# Patient Record
Sex: Female | Born: 1995
Health system: Southern US, Community
[De-identification: ages and names within clinical notes are randomized; demographics above are authoritative.]

## PROBLEM LIST (undated history)

## (undated) DIAGNOSIS — Z789 Other specified health status: Secondary | ICD-10-CM

---
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
## 2017-06-24 ENCOUNTER — Encounter: Payer: Self-pay | Admitting: Family Medicine

## 2017-06-24 ENCOUNTER — Ambulatory Visit (INDEPENDENT_AMBULATORY_CARE_PROVIDER_SITE_OTHER): Payer: BLUE CROSS/BLUE SHIELD | Admitting: Family Medicine

## 2017-06-24 ENCOUNTER — Other Ambulatory Visit: Payer: Self-pay

## 2017-06-24 VITALS — BP 110/62 | HR 96 | Temp 99.2°F | Resp 17 | Ht 69.0 in | Wt 156.8 lb

## 2017-06-24 DIAGNOSIS — N912 Amenorrhea, unspecified: Secondary | ICD-10-CM

## 2017-06-24 DIAGNOSIS — Z3201 Encounter for pregnancy test, result positive: Secondary | ICD-10-CM | POA: Diagnosis not present

## 2017-06-24 DIAGNOSIS — O210 Mild hyperemesis gravidarum: Secondary | ICD-10-CM

## 2017-06-24 LAB — POCT URINE PREGNANCY: Preg Test, Ur: POSITIVE — AB

## 2017-06-24 NOTE — Patient Instructions (Addendum)
Prenatal Vitamin with [facility]  Make an Appointment Call [phone] to make an appointment.     IF you received an x-ray today, you will receive an invoice from [facility]. Please contact [facility] at [phone] with questions or concerns regarding your invoice.   IF you received labwork today, you will receive an invoice from [facility]. Please contact [facility] at [phone] with questions or concerns regarding your invoice.   Our billing staff will not be able to assist you with questions regarding bills from these companies.  You will be contacted with the lab results as soon as they are available. The fastest way to get your results is to activate your My Chart account. Instructions are located on the last page of this paperwork. If you have not heard from [place] regarding the results in 2 weeks, please contact this office.     Prenatal Care WHAT IS PRENATAL CARE? Prenatal care is the process of caring for a pregnant woman before she gives birth. Prenatal care makes sure that she and her baby remain as healthy as possible throughout pregnancy. Prenatal care may be provided by a midwife, family practice health care provider, or a childbirth and pregnancy specialist (obstetrician). Prenatal care may include physical examinations, testing, treatments, and education on nutrition, lifestyle, and social support services. WHY IS PRENATAL CARE SO IMPORTANT? Early and consistent prenatal care increases the chance that you and your baby will remain healthy throughout your pregnancy. This type of care also decreases a baby's risk of being born too early (prematurely), or being born smaller than expected (small for gestational age). Any underlying medical conditions you may have that could pose a risk during your pregnancy are discussed during prenatal care visits. You will also be monitored regularly for any new conditions that may arise during your pregnancy so they can be treated  quickly and effectively. WHAT HAPPENS DURING PRENATAL CARE VISITS? Prenatal care visits may include the following: Discussion Tell your health care provider about any new signs or symptoms you have experienced since your last visit. These might include:  Nausea or vomiting.  Increased or decreased level of energy.  Difficulty sleeping.  Back or leg pain.  Weight changes.  Frequent urination.  Shortness of breath with physical activity.  Changes in your skin, such as the development of a rash or itchiness.  Vaginal discharge or bleeding.  Feelings of excitement or nervousness.  Changes in your baby's movements.  You may want to write down any questions or topics you want to discuss with your health care provider and bring them with you to your appointment. Examination During your first prenatal care visit, you will likely have a complete physical exam. Your health care provider will often examine your vagina, cervix, and the position of your uterus, as well as check your heart, lungs, and other body systems. As your pregnancy progresses, your health care provider will measure the size of your uterus and your baby's position inside your uterus. He or she may also examine you for early signs of labor. Your prenatal visits may also include checking your blood pressure and, after about 10-12 weeks of pregnancy, listening to your baby's heartbeat. Testing Regular testing often includes:  Urinalysis. This checks your urine for glucose, protein, or signs of infection.  Blood count. This checks the levels of white and red blood cells in your body.  Tests for sexually transmitted infections (STIs). Testing for STIs at the beginning of pregnancy is routinely done and is required in  many states.  Antibody testing. You will be checked to see if you are immune to certain illnesses, such as rubella, that can affect a developing fetus.  Glucose screen. Around 24-28 weeks of pregnancy, your  blood glucose level will be checked for signs of gestational diabetes. Follow-up tests may be recommended.  Group B strep. This is a bacteria that is commonly found inside a woman's vagina. This test will inform your health care provider if you need an antibiotic to reduce the amount of this bacteria in your body prior to labor and childbirth.  Ultrasound. Many pregnant women undergo an ultrasound screening around 18-20 weeks of pregnancy to evaluate the health of the fetus and check for any developmental abnormalities.  HIV (human immunodeficiency virus) testing. Early in your pregnancy, you will be screened for HIV. If you are at high risk for HIV, this test may be repeated during your third trimester of pregnancy.  You may be offered other testing based on your age, personal or family medical history, or other factors. HOW OFTEN SHOULD I PLAN TO SEE MY HEALTH CARE PROVIDER FOR PRENATAL CARE? Your prenatal care check-up schedule depends on any medical conditions you have before, or develop during, your pregnancy. If you do not have any underlying medical conditions, you will likely be seen for checkups:  Monthly, during the first 6 months of pregnancy.  Twice a month during months 7 and 8 of pregnancy.  Weekly starting in the 9th month of pregnancy and until delivery.  If you develop signs of early labor or other concerning signs or symptoms, you may need to see your health care provider more often. Ask your health care provider what prenatal care schedule is best for you. WHAT CAN I DO TO KEEP MYSELF AND MY BABY AS HEALTHY AS POSSIBLE DURING MY PREGNANCY?  Take a prenatal vitamin containing 400 micrograms (0.4 mg) of folic acid every day. Your health care provider may also ask you to take additional vitamins such as iodine, vitamin D, iron, copper, and zinc.  Take 1500-2000 mg of calcium daily starting at your 20th week of pregnancy until you deliver your baby.  Make sure you are up to  date on your vaccinations. Unless directed otherwise by your health care provider: ? You should receive a tetanus, diphtheria, and pertussis (Tdap) vaccination between the 27th and 36th week of your pregnancy, regardless of when your last Tdap immunization occurred. This helps protect your baby from whooping cough (pertussis) after he or she is born. ? You should receive an annual inactivated influenza vaccine (IIV) to help protect you and your baby from influenza. This can be done at any point during your pregnancy.  Eat a well-rounded diet that includes: ? Fresh fruits and vegetables. ? Lean proteins. ? Calcium-rich foods such as milk, yogurt, hard cheeses, and dark, leafy greens. ? Whole grain breads.  Do noteat seafood high in mercury, including: ? Swordfish. ? Tilefish. ? Shark. ? King mackerel. ? More than 6 oz tuna per week.  Do not eat: ? Raw or undercooked meats or eggs. ? Unpasteurized foods, such as soft cheeses (brie, blue, or feta), juices, and milks. ? Lunch meats. ? Hot dogs that have not been heated until they are steaming.  Drink enough water to keep your urine clear or pale yellow. For many women, this may be 10 or more 8 oz glasses of water each day. Keeping yourself hydrated helps deliver nutrients to your baby and may prevent the start of  pre-term uterine contractions.  Do not use any tobacco products including cigarettes, chewing tobacco, or electronic cigarettes. If you need help quitting, ask your health care provider.  Do not drink beverages containing alcohol. No safe level of alcohol consumption during pregnancy has been determined.  Do not use any illegal drugs. These can harm your developing baby or cause a miscarriage.  Ask your health care provider or pharmacist before taking any prescription or over-the-counter medicines, herbs, or supplements.  Limit your caffeine intake to no more than 200 mg per day.  Exercise. Unless told otherwise by your  health care provider, try to get 30 minutes of moderate exercise most days of the week. Do not  do high-impact activities, contact sports, or activities with a high risk of falling, such as horseback riding or downhill skiing.  Get plenty of rest.  Avoid anything that raises your body temperature, such as hot tubs and saunas.  If you own a cat, do not empty its litter box. Bacteria contained in cat feces can cause an infection called toxoplasmosis. This can result in serious harm to the fetus.  Stay away from chemicals such as insecticides, lead, mercury, and cleaning or paint products that contain solvents.  Do not have any X-rays taken unless medically necessary.  Take a childbirth and breastfeeding preparation class. Ask your health care provider if you need a referral or recommendation.  This information is not intended to replace advice given to you by your health care provider. Make sure you discuss any questions you have with your health care provider. Document Released: 01/02/2003 Document Revised: 06/04/2015 Document Reviewed: 03/16/2013 Elsevier Interactive Patient Education  2017 [employer]

## 2017-06-24 NOTE — Progress Notes (Signed)
Chief Complaint  Patient presents with  . pregnancy test    HPI   G1P0 She was not on contraception This is her first pregnancy This was not a planned pregnancy She currently has nausea  Her EDD 02/18/2018 based on estimated 1st day of LMP Patient's last menstrual period was 05/14/2017.  Nonsmoker No alcohol use   No past medical history on file.  Current Outpatient Medications  Medication Sig Dispense Refill  . diazepam (VALIUM) 5 MG tablet Take 1 tablet (5 mg total) by mouth every 6 (six) hours as needed for anxiety (spasms). (Patient not taking: Reported on 06/24/2017) 8 tablet 0  . Ibuprofen (ADVIL PO) Take 2 tablets by mouth once. For pain    . naproxen (NAPROSYN) 500 MG tablet Take 1 tablet (500 mg total) by mouth 2 (two) times daily. (Patient not taking: Reported on 06/24/2017) 20 tablet 0  . ondansetron (ZOFRAN ODT) 4 MG disintegrating tablet 4mg  ODT q4 hours prn nausea/vomit (Patient not taking: Reported on 06/24/2017) 6 tablet 0  . PRESCRIPTION MEDICATION Take 0.5 tablets by mouth 2 (two) times daily as needed (pain). Pain medication for urinary tract infection     No current facility-administered medications for this visit.     Allergies: No Known Allergies  No past surgical history on file.  Social History   Socioeconomic History  . Marital status: Single    Spouse name: Not on file  . Number of children: Not on file  . Years of education: Not on file  . Highest education level: Not on file  Occupational History  . Not on file  Social Needs  . Financial resource strain: Not on file  . Food insecurity:    Worry: Not on file    Inability: Not on file  . Transportation needs:    Medical: Not on file    Non-medical: Not on file  Tobacco Use  . Smoking status: Never Smoker  Substance and Sexual Activity  . Alcohol use: No  . Drug use: Not on file  . Sexual activity: Not on file  Lifestyle  . Physical activity:    Days per week: Not on file    Minutes  per session: Not on file  . Stress: Not on file  Relationships  . Social connections:    Talks on phone: Not on file    Gets together: Not on file    Attends religious service: Not on file    Active member of club or organization: Not on file    Attends meetings of clubs or organizations: Not on file    Relationship status: Not on file  Other Topics Concern  . Not on file  Social History Narrative  . Not on file    No family history on file.   ROS Review of Systems See HPI Constitution: No fevers or chills No malaise No diaphoresis Skin: No rash or itching Eyes: no blurry vision, no double vision GU: no dysuria or hematuria Neuro: no dizziness or headaches all others reviewed and negative   Objective: Vitals:   06/24/17 1007  BP: 110/62  Pulse: 96  Resp: 17  Temp: 99.2 F (37.3 C)  TempSrc: Oral  SpO2: 99%  Weight: 156 lb 12.8 oz (71.1 kg)  Height: 5\' 9"  (1.753 m)    Physical Exam  Constitutional: She is oriented to person, place, and time. She appears well-developed and well-nourished.  HENT:  Head: Normocephalic and atraumatic.  Right Ear: External ear normal.  Left Ear:  External ear normal.  Eyes: Conjunctivae and EOM are normal.  Neck: Normal range of motion. Neck supple. No thyromegaly present.  Cardiovascular: Normal rate, regular rhythm and normal heart sounds.  No murmur heard. Pulmonary/Chest: Effort normal and breath sounds normal. No stridor. No respiratory distress.  Neurological: She is alert and oriented to person, place, and time.  Skin: Skin is warm. Capillary refill takes less than 2 seconds.  Psychiatric: She has a normal mood and affect. Her behavior is normal. Judgment and thought content normal.    Assessment and Plan Brittany Gaines was seen today for pregnancy test.  Diagnoses and all orders for this visit:  Amenorrhea -     POCT urine pregnancy  Positive pregnancy test  Morning sickness   Advised pt to call [facility] to  establish care with OB Discussed diet and medication     Brittany Gaines A Creta Levin

## 2017-07-22 ENCOUNTER — Encounter: Payer: Self-pay | Admitting: Certified Nurse Midwife

## 2017-07-22 ENCOUNTER — Other Ambulatory Visit (HOSPITAL_COMMUNITY)
Admission: RE | Admit: 2017-07-22 | Discharge: 2017-07-22 | Disposition: A | Discharge status: Home / Self Care | Payer: Medicaid Other | Source: Ambulatory Visit | Attending: Obstetrics and Gynecology | Admitting: Obstetrics and Gynecology

## 2017-07-22 ENCOUNTER — Ambulatory Visit (INDEPENDENT_AMBULATORY_CARE_PROVIDER_SITE_OTHER): Payer: Medicaid Other | Admitting: Obstetrics and Gynecology

## 2017-07-22 DIAGNOSIS — Z3400 Encounter for supervision of normal first pregnancy, unspecified trimester: Secondary | ICD-10-CM | POA: Insufficient documentation

## 2017-07-22 DIAGNOSIS — Z3401 Encounter for supervision of normal first pregnancy, first trimester: Secondary | ICD-10-CM | POA: Diagnosis present

## 2017-07-22 NOTE — Patient Instructions (Signed)
 First Trimester of Pregnancy The first trimester of pregnancy is from week 1 until the end of week 13 (months 1 through 3). A week after a sperm fertilizes an egg, the egg will implant on the wall of the uterus. This embryo will begin to develop into a baby. Genes from you and your partner will form the baby. The female genes will determine whether the baby will be a boy or a girl. At 6-8 weeks, the eyes and face will be formed, and the heartbeat can be seen on ultrasound. At the end of 12 weeks, all the baby's organs will be formed. Now that you are pregnant, you will want to do everything you can to have a healthy baby. Two of the most important things are to get good prenatal care and to follow your health care provider's instructions. Prenatal care is all the medical care you receive before the baby's birth. This care will help prevent, find, and treat any problems during the pregnancy and childbirth. Body changes during your first trimester Your body goes through many changes during pregnancy. The changes vary from woman to woman.  You may gain or lose a couple of pounds at first.  You may feel sick to your stomach (nauseous) and you may throw up (vomit). If the vomiting is uncontrollable, call your health care provider.  You may tire easily.  You may develop headaches that can be relieved by medicines. All medicines should be approved by your health care provider.  You may urinate more often. Painful urination may mean you have a bladder infection.  You may develop heartburn as a result of your pregnancy.  You may develop constipation because certain hormones are causing the muscles that push stool through your intestines to slow down.  You may develop hemorrhoids or swollen veins (varicose veins).  Your breasts may begin to grow larger and become tender. Your nipples may stick out more, and the tissue that surrounds them (areola) may become darker.  Your gums may bleed and may be  sensitive to brushing and flossing.  Dark spots or blotches (chloasma, mask of pregnancy) may develop on your face. This will likely fade after the baby is born.  Your menstrual periods will stop.  You may have a loss of appetite.  You may develop cravings for certain kinds of food.  You may have changes in your emotions from day to day, such as being excited to be pregnant or being concerned that something may go wrong with the pregnancy and baby.  You may have more vivid and strange dreams.  You may have changes in your hair. These can include thickening of your hair, rapid growth, and changes in texture. Some women also have hair loss during or after pregnancy, or hair that feels dry or thin. Your hair will most likely return to normal after your baby is born.  What to expect at prenatal visits During a routine prenatal visit:  You will be weighed to make sure you and the baby are growing normally.  Your blood pressure will be taken.  Your abdomen will be measured to track your baby's growth.  The fetal heartbeat will be listened to between weeks 10 and 14 of your pregnancy.  Test results from any previous visits will be discussed.  Your health care provider may ask you:  How you are feeling.  If you are feeling the baby move.  If you have had any abnormal symptoms, such as leaking fluid, bleeding, severe   headaches, or abdominal cramping.  If you are using any tobacco products, including cigarettes, chewing tobacco, and electronic cigarettes.  If you have any questions.  Other tests that may be performed during your first trimester include:  Blood tests to find your blood type and to check for the presence of any previous infections. The tests will also be used to check for low iron levels (anemia) and protein on red blood cells (Rh antibodies). Depending on your risk factors, or if you previously had diabetes during pregnancy, you may have tests to check for high blood  sugar that affects pregnant women (gestational diabetes).  Urine tests to check for infections, diabetes, or protein in the urine.  An ultrasound to confirm the proper growth and development of the baby.  Fetal screens for spinal cord problems (spina bifida) and Down syndrome.  HIV (human immunodeficiency virus) testing. Routine prenatal testing includes screening for HIV, unless you choose not to have this test.  You may need other tests to make sure you and the baby are doing well.  Follow these instructions at home: Medicines  Follow your health care provider's instructions regarding medicine use. Specific medicines may be either safe or unsafe to take during pregnancy.  Take a prenatal vitamin that contains at least 600 micrograms (mcg) of folic acid.  If you develop constipation, try taking a stool softener if your health care provider approves. Eating and drinking  Eat a balanced diet that includes fresh fruits and vegetables, whole grains, good sources of protein such as meat, eggs, or tofu, and low-fat dairy. Your health care provider will help you determine the amount of weight gain that is right for you.  Avoid raw meat and uncooked cheese. These carry germs that can cause birth defects in the baby.  Eating four or five small meals rather than three large meals a day may help relieve nausea and vomiting. If you start to feel nauseous, eating a few soda crackers can be helpful. Drinking liquids between meals, instead of during meals, also seems to help ease nausea and vomiting.  Limit foods that are high in fat and processed sugars, such as fried and sweet foods.  To prevent constipation: ? Eat foods that are high in fiber, such as fresh fruits and vegetables, whole grains, and beans. ? Drink enough fluid to keep your urine clear or pale yellow. Activity  Exercise only as directed by your health care provider. Most women can continue their usual exercise routine during  pregnancy. Try to exercise for 30 minutes at least 5 days a week. Exercising will help you: ? Control your weight. ? Stay in shape. ? Be prepared for labor and delivery.  Experiencing pain or cramping in the lower abdomen or lower back is a good sign that you should stop exercising. Check with your health care provider before continuing with normal exercises.  Try to avoid standing for long periods of time. Move your legs often if you must stand in one place for a long time.  Avoid heavy lifting.  Wear low-heeled shoes and practice good posture.  You may continue to have sex unless your health care provider tells you not to. Relieving pain and discomfort  Wear a good support bra to relieve breast tenderness.  Take warm sitz baths to soothe any pain or discomfort caused by hemorrhoids. Use hemorrhoid cream if your health care provider approves.  Rest with your legs elevated if you have leg cramps or low back pain.  If you   develop varicose veins in your legs, wear support hose. Elevate your feet for 15 minutes, 3-4 times a day. Limit salt in your diet. Prenatal care  Schedule your prenatal visits by the twelfth week of pregnancy. They are usually scheduled monthly at first, then more often in the last 2 months before delivery.  Write down your questions. Take them to your prenatal visits.  Keep all your prenatal visits as told by your health care provider. This is important. Safety  Wear your seat belt at all times when driving.  Make a list of emergency phone numbers, including numbers for family, friends, the hospital, and police and fire departments. General instructions  Ask your health care provider for a referral to a local prenatal education class. Begin classes no later than the beginning of month 6 of your pregnancy.  Ask for help if you have counseling or nutritional needs during pregnancy. Your health care provider can offer advice or refer you to specialists for help  with various needs.  Do not use hot tubs, steam rooms, or saunas.  Do not douche or use tampons or scented sanitary pads.  Do not cross your legs for long periods of time.  Avoid cat litter boxes and soil used by cats. These carry germs that can cause birth defects in the baby and possibly loss of the fetus by miscarriage or stillbirth.  Avoid all smoking, herbs, alcohol, and medicines not prescribed by your health care provider. Chemicals in these products affect the formation and growth of the baby.  Do not use any products that contain nicotine or tobacco, such as cigarettes and e-cigarettes. If you need help quitting, ask your health care provider. You may receive counseling support and other resources to help you quit.  Schedule a dentist appointment. At home, brush your teeth with a soft toothbrush and be gentle when you floss. Contact a health care provider if:  You have dizziness.  You have mild pelvic cramps, pelvic pressure, or nagging pain in the abdominal area.  You have persistent nausea, vomiting, or diarrhea.  You have a bad smelling vaginal discharge.  You have pain when you urinate.  You notice increased swelling in your face, hands, legs, or ankles.  You are exposed to fifth disease or chickenpox.  You are exposed to German measles (rubella) and have never had it. Get help right away if:  You have a fever.  You are leaking fluid from your vagina.  You have spotting or bleeding from your vagina.  You have severe abdominal cramping or pain.  You have rapid weight gain or loss.  You vomit blood or material that looks like coffee grounds.  You develop a severe headache.  You have shortness of breath.  You have any kind of trauma, such as from a fall or a car accident. Summary  The first trimester of pregnancy is from week 1 until the end of week 13 (months 1 through 3).  Your body goes through many changes during pregnancy. The changes vary from  woman to woman.  You will have routine prenatal visits. During those visits, your health care provider will examine you, discuss any test results you may have, and talk with you about how you are feeling. This information is not intended to replace advice given to you by your health care provider. Make sure you discuss any questions you have with your health care provider. Document Released: 12/24/2000 Document Revised: 12/12/2015 Document Reviewed: 12/12/2015 [employer] Interactive Patient Education  2018   [employer]   Second Trimester of Pregnancy The second trimester is from week 14 through week 27 (months 4 through 6). The second trimester is often a time when you feel your best. Your body has adjusted to being pregnant, and you begin to feel better physically. Usually, morning sickness has lessened or quit completely, you may have more energy, and you may have an increase in appetite. The second trimester is also a time when the fetus is growing rapidly. At the end of the sixth month, the fetus is about 9 inches long and weighs about 1 pounds. You will likely begin to feel the baby move (quickening) between 16 and 20 weeks of pregnancy. Body changes during your second trimester Your body continues to go through many changes during your second trimester. The changes vary from woman to woman.  Your weight will continue to increase. You will notice your lower abdomen bulging out.  You may begin to get stretch marks on your hips, abdomen, and breasts.  You may develop headaches that can be relieved by medicines. The medicines should be approved by your health care provider.  You may urinate more often because the fetus is pressing on your bladder.  You may develop or continue to have heartburn as a result of your pregnancy.  You may develop constipation because certain hormones are causing the muscles that push waste through your intestines to slow down.  You may develop hemorrhoids or  swollen, bulging veins (varicose veins).  You may have back pain. This is caused by: ? Weight gain. ? Pregnancy hormones that are relaxing the joints in your pelvis. ? A shift in weight and the muscles that support your balance.  Your breasts will continue to grow and they will continue to become tender.  Your gums may bleed and may be sensitive to brushing and flossing.  Dark spots or blotches (chloasma, mask of pregnancy) may develop on your face. This will likely fade after the baby is born.  A dark line from your belly button to the pubic area (linea nigra) may appear. This will likely fade after the baby is born.  You may have changes in your hair. These can include thickening of your hair, rapid growth, and changes in texture. Some women also have hair loss during or after pregnancy, or hair that feels dry or thin. Your hair will most likely return to normal after your baby is born.  What to expect at prenatal visits During a routine prenatal visit:  You will be weighed to make sure you and the fetus are growing normally.  Your blood pressure will be taken.  Your abdomen will be measured to track your baby's growth.  The fetal heartbeat will be listened to.  Any test results from the previous visit will be discussed.  Your health care provider may ask you:  How you are feeling.  If you are feeling the baby move.  If you have had any abnormal symptoms, such as leaking fluid, bleeding, severe headaches, or abdominal cramping.  If you are using any tobacco products, including cigarettes, chewing tobacco, and electronic cigarettes.  If you have any questions.  Other tests that may be performed during your second trimester include:  Blood tests that check for: ? Low iron levels (anemia). ? High blood sugar that affects pregnant women (gestational diabetes) between 24 and 28 weeks. ? Rh antibodies. This is to check for a protein on red blood cells (Rh factor).  Urine  tests to   check for infections, diabetes, or protein in the urine.  An ultrasound to confirm the proper growth and development of the baby.  An amniocentesis to check for possible genetic problems.  Fetal screens for spina bifida and Down syndrome.  HIV (human immunodeficiency virus) testing. Routine prenatal testing includes screening for HIV, unless you choose not to have this test.  Follow these instructions at home: Medicines  Follow your health care provider's instructions regarding medicine use. Specific medicines may be either safe or unsafe to take during pregnancy.  Take a prenatal vitamin that contains at least 600 micrograms (mcg) of folic acid.  If you develop constipation, try taking a stool softener if your health care provider approves. Eating and drinking  Eat a balanced diet that includes fresh fruits and vegetables, whole grains, good sources of protein such as meat, eggs, or tofu, and low-fat dairy. Your health care provider will help you determine the amount of weight gain that is right for you.  Avoid raw meat and uncooked cheese. These carry germs that can cause birth defects in the baby.  If you have low calcium intake from food, talk to your health care provider about whether you should take a daily calcium supplement.  Limit foods that are high in fat and processed sugars, such as fried and sweet foods.  To prevent constipation: ? Drink enough fluid to keep your urine clear or pale yellow. ? Eat foods that are high in fiber, such as fresh fruits and vegetables, whole grains, and beans. Activity  Exercise only as directed by your health care provider. Most women can continue their usual exercise routine during pregnancy. Try to exercise for 30 minutes at least 5 days a week. Stop exercising if you experience uterine contractions.  Avoid heavy lifting, wear low heel shoes, and practice good posture.  A sexual relationship may be continued unless your health  care provider directs you otherwise. Relieving pain and discomfort  Wear a good support bra to prevent discomfort from breast tenderness.  Take warm sitz baths to soothe any pain or discomfort caused by hemorrhoids. Use hemorrhoid cream if your health care provider approves.  Rest with your legs elevated if you have leg cramps or low back pain.  If you develop varicose veins, wear support hose. Elevate your feet for 15 minutes, 3-4 times a day. Limit salt in your diet. Prenatal Care  Write down your questions. Take them to your prenatal visits.  Keep all your prenatal visits as told by your health care provider. This is important. Safety  Wear your seat belt at all times when driving.  Make a list of emergency phone numbers, including numbers for family, friends, the hospital, and police and fire departments. General instructions  Ask your health care provider for a referral to a local prenatal education class. Begin classes no later than the beginning of month 6 of your pregnancy.  Ask for help if you have counseling or nutritional needs during pregnancy. Your health care provider can offer advice or refer you to specialists for help with various needs.  Do not use hot tubs, steam rooms, or saunas.  Do not douche or use tampons or scented sanitary pads.  Do not cross your legs for long periods of time.  Avoid cat litter boxes and soil used by cats. These carry germs that can cause birth defects in the baby and possibly loss of the fetus by miscarriage or stillbirth.  Avoid all smoking, herbs, alcohol, and unprescribed drugs. Chemicals   in these products can affect the formation and growth of the baby.  Do not use any products that contain nicotine or tobacco, such as cigarettes and e-cigarettes. If you need help quitting, ask your health care provider.  Visit your dentist if you have not gone yet during your pregnancy. Use a soft toothbrush to brush your teeth and be gentle when  you floss. Contact a health care provider if:  You have dizziness.  You have mild pelvic cramps, pelvic pressure, or nagging pain in the abdominal area.  You have persistent nausea, vomiting, or diarrhea.  You have a bad smelling vaginal discharge.  You have pain when you urinate. Get help right away if:  You have a fever.  You are leaking fluid from your vagina.  You have spotting or bleeding from your vagina.  You have severe abdominal cramping or pain.  You have rapid weight gain or weight loss.  You have shortness of breath with chest pain.  You notice sudden or extreme swelling of your face, hands, ankles, feet, or legs.  You have not felt your baby move in over an hour.  You have severe headaches that do not go away when you take medicine.  You have vision changes. Summary  The second trimester is from week 14 through week 27 (months 4 through 6). It is also a time when the fetus is growing rapidly.  Your body goes through many changes during pregnancy. The changes vary from woman to woman.  Avoid all smoking, herbs, alcohol, and unprescribed drugs. These chemicals affect the formation and growth your baby.  Do not use any tobacco products, such as cigarettes, chewing tobacco, and e-cigarettes. If you need help quitting, ask your health care provider.  Contact your health care provider if you have any questions. Keep all prenatal visits as told by your health care provider. This is important. This information is not intended to replace advice given to you by your health care provider. Make sure you discuss any questions you have with your health care provider. Document Released: 12/24/2000 Document Revised: 02/05/2016 Document Reviewed: 02/05/2016 [employer] Interactive Patient Education  2018 [employer]   Contraception Choices Contraception, also called birth control, refers to methods or devices that prevent pregnancy. Hormonal methods Contraceptive  implant A contraceptive implant is a thin, plastic tube that contains a hormone. It is inserted into the upper part of the arm. It can remain in place for up to 3 years. Progestin-only injections Progestin-only injections are injections of progestin, a synthetic form of the hormone progesterone. They are given every 3 months by a health care provider. Birth control pills Birth control pills are pills that contain hormones that prevent pregnancy. They must be taken once a day, preferably at the same time each day. Birth control patch The birth control patch contains hormones that prevent pregnancy. It is placed on the skin and must be changed once a week for three weeks and removed on the fourth week. A prescription is needed to use this method of contraception. Vaginal ring A vaginal ring contains hormones that prevent pregnancy. It is placed in the vagina for three weeks and removed on the fourth week. After that, the process is repeated with a new ring. A prescription is needed to use this method of contraception. Emergency contraceptive Emergency contraceptives prevent pregnancy after unprotected sex. They come in pill form and can be taken up to 5 days after sex. They work best the sooner they are taken after having   sex. Most emergency contraceptives are available without a prescription. This method should not be used as your only form of birth control. Barrier methods Female condom A female condom is a thin sheath that is worn over the penis during sex. Condoms keep sperm from going inside a woman's body. They can be used with a spermicide to increase their effectiveness. They should be disposed after a single use. Female condom A female condom is a soft, loose-fitting sheath that is put into the vagina before sex. The condom keeps sperm from going inside a woman's body. They should be disposed after a single use. Diaphragm A diaphragm is a soft, dome-shaped barrier. It is inserted into the vagina  before sex, along with a spermicide. The diaphragm blocks sperm from entering the uterus, and the spermicide kills sperm. A diaphragm should be left in the vagina for 6-8 hours after sex and removed within 24 hours. A diaphragm is prescribed and fitted by a health care provider. A diaphragm should be replaced every 1-2 years, after giving birth, after gaining more than 15 lb (6.8 kg), and after pelvic surgery. Cervical cap A cervical cap is a round, soft latex or plastic cup that fits over the cervix. It is inserted into the vagina before sex, along with spermicide. It blocks sperm from entering the uterus. The cap should be left in place for 6-8 hours after sex and removed within 48 hours. A cervical cap must be prescribed and fitted by a health care provider. It should be replaced every 2 years. Sponge A sponge is a soft, circular piece of polyurethane foam with spermicide on it. The sponge helps block sperm from entering the uterus, and the spermicide kills sperm. To use it, you make it wet and then insert it into the vagina. It should be inserted before sex, left in for at least 6 hours after sex, and removed and thrown away within 30 hours. Spermicides Spermicides are chemicals that kill or block sperm from entering the cervix and uterus. They can come as a cream, jelly, suppository, foam, or tablet. A spermicide should be inserted into the vagina with an applicator at least 10-15 minutes before sex to allow time for it to work. The process must be repeated every time you have sex. Spermicides do not require a prescription. Intrauterine contraception Intrauterine device (IUD) An IUD is a T-shaped device that is put in a woman's uterus. There are two types:  Hormone IUD.This type contains progestin, a synthetic form of the hormone progesterone. This type can stay in place for 3-5 years.  Copper IUD.This type is wrapped in copper wire. It can stay in place for 10 years.  Permanent methods of  contraception Female tubal ligation In this method, a woman's fallopian tubes are sealed, tied, or blocked during surgery to prevent eggs from traveling to the uterus. Hysteroscopic sterilization In this method, a small, flexible insert is placed into each fallopian tube. The inserts cause scar tissue to form in the fallopian tubes and block them, so sperm cannot reach an egg. The procedure takes about 3 months to be effective. Another form of birth control must be used during those 3 months. Female sterilization This is a procedure to tie off the tubes that carry sperm (vasectomy). After the procedure, the man can still ejaculate fluid (semen). Natural planning methods Natural family planning In this method, a couple does not have sex on days when the woman could become pregnant. Calendar method This means keeping track of the   length of each menstrual cycle, identifying the days when pregnancy can happen, and not having sex on those days. Ovulation method In this method, a couple avoids sex during ovulation. Symptothermal method This method involves not having sex during ovulation. The woman typically checks for ovulation by watching changes in her temperature and in the consistency of cervical mucus. Post-ovulation method In this method, a couple waits to have sex until after ovulation. Summary  Contraception, also called birth control, means methods or devices that prevent pregnancy.  Hormonal methods of contraception include implants, injections, pills, patches, vaginal rings, and emergency contraceptives.  Barrier methods of contraception can include female condoms, female condoms, diaphragms, cervical caps, sponges, and spermicides.  There are two types of IUDs (intrauterine devices). An IUD can be put in a woman's uterus to prevent pregnancy for 3-5 years.  Permanent sterilization can be done through a procedure for males, females, or both.  Natural family planning methods involve  not having sex on days when the woman could become pregnant. This information is not intended to replace advice given to you by your health care provider. Make sure you discuss any questions you have with your health care provider. Document Released: 12/30/2004 Document Revised: 02/02/2016 Document Reviewed: 02/02/2016 Elsevier Interactive Patient Education  2018 [employer] Inc.   Breastfeeding Choosing to breastfeed is one of the best decisions you can make for yourself and your baby. A change in hormones during pregnancy causes your breasts to make breast milk in your milk-producing glands. Hormones prevent breast milk from being released before your baby is born. They also prompt milk flow after birth. Once breastfeeding has begun, thoughts of your baby, as well as his or her sucking or crying, can stimulate the release of milk from your milk-producing glands. Benefits of breastfeeding Research shows that breastfeeding offers many health benefits for infants and mothers. It also offers a cost-free and convenient way to feed your baby. For your baby  Your first milk (colostrum) helps your baby's digestive system to function better.  Special cells in your milk (antibodies) help your baby to fight off infections.  Breastfed babies are less likely to develop asthma, allergies, obesity, or type 2 diabetes. They are also at lower risk for sudden infant death syndrome (SIDS).  Nutrients in breast milk are better able to meet your baby's needs compared to infant formula.  Breast milk improves your baby's brain development. For you  Breastfeeding helps to create a very special bond between you and your baby.  Breastfeeding is convenient. Breast milk costs nothing and is always available at the correct temperature.  Breastfeeding helps to burn calories. It helps you to lose the weight that you gained during pregnancy.  Breastfeeding makes your uterus return faster to its size before pregnancy.  It also slows bleeding (lochia) after you give birth.  Breastfeeding helps to lower your risk of developing type 2 diabetes, osteoporosis, rheumatoid arthritis, cardiovascular disease, and breast, ovarian, uterine, and endometrial cancer later in life. Breastfeeding basics Starting breastfeeding  Find a comfortable place to sit or lie down, with your neck and back well-supported.  Place a pillow or a rolled-up blanket under your baby to bring him or her to the level of your breast (if you are seated). Nursing pillows are specially designed to help support your arms and your baby while you breastfeed.  Make sure that your baby's tummy (abdomen) is facing your abdomen.  Gently massage your breast. With your fingertips, massage from the outer edges of   your breast inward toward the nipple. This encourages milk flow. If your milk flows slowly, you may need to continue this action during the feeding.  Support your breast with 4 fingers underneath and your thumb above your nipple (make the letter "C" with your hand). Make sure your fingers are well away from your nipple and your baby's mouth.  Stroke your baby's lips gently with your finger or nipple.  When your baby's mouth is open wide enough, quickly bring your baby to your breast, placing your entire nipple and as much of the areola as possible into your baby's mouth. The areola is the colored area around your nipple. ? More areola should be visible above your baby's upper lip than below the lower lip. ? Your baby's lips should be opened and extended outward (flanged) to ensure an adequate, comfortable latch. ? Your baby's tongue should be between his or her lower gum and your breast.  Make sure that your baby's mouth is correctly positioned around your nipple (latched). Your baby's lips should create a seal on your breast and be turned out (everted).  It is common for your baby to suck about 2-3 minutes in order to start the flow of breast  milk. Latching Teaching your baby how to latch onto your breast properly is very important. An improper latch can cause nipple pain, decreased milk supply, and poor weight gain in your baby. Also, if your baby is not latched onto your nipple properly, he or she may swallow some air during feeding. This can make your baby fussy. Burping your baby when you switch breasts during the feeding can help to get rid of the air. However, teaching your baby to latch on properly is still the best way to prevent fussiness from swallowing air while breastfeeding. Signs that your baby has successfully latched onto your nipple  Silent tugging or silent sucking, without causing you pain. Infant's lips should be extended outward (flanged).  Swallowing heard between every 3-4 sucks once your milk has started to flow (after your let-down milk reflex occurs).  Muscle movement above and in front of his or her ears while sucking.  Signs that your baby has not successfully latched onto your nipple  Sucking sounds or smacking sounds from your baby while breastfeeding.  Nipple pain.  If you think your baby has not latched on correctly, slip your finger into the corner of your baby's mouth to break the suction and place it between your baby's gums. Attempt to start breastfeeding again. Signs of successful breastfeeding Signs from your baby  Your baby will gradually decrease the number of sucks or will completely stop sucking.  Your baby will fall asleep.  Your baby's body will relax.  Your baby will retain a small amount of milk in his or her mouth.  Your baby will let go of your breast by himself or herself.  Signs from you  Breasts that have increased in firmness, weight, and size 1-3 hours after feeding.  Breasts that are softer immediately after breastfeeding.  Increased milk volume, as well as a change in milk consistency and color by the fifth day of breastfeeding.  Nipples that are not sore,  cracked, or bleeding.  Signs that your baby is getting enough milk  Wetting at least 1-2 diapers during the first 24 hours after birth.  Wetting at least 5-6 diapers every 24 hours for the first week after birth. The urine should be clear or pale yellow by the age of 5   days.  Wetting 6-8 diapers every 24 hours as your baby continues to grow and develop.  At least 3 stools in a 24-hour period by the age of 5 days. The stool should be soft and yellow.  At least 3 stools in a 24-hour period by the age of 7 days. The stool should be seedy and yellow.  No loss of weight greater than 10% of birth weight during the first 3 days of life.  Average weight gain of 4-7 oz (113-198 g) per week after the age of 4 days.  Consistent daily weight gain by the age of 5 days, without weight loss after the age of 2 weeks. After a feeding, your baby may spit up a small amount of milk. This is normal. Breastfeeding frequency and duration Frequent feeding will help you make more milk and can prevent sore nipples and extremely full breasts (breast engorgement). Breastfeed when you feel the need to reduce the fullness of your breasts or when your baby shows signs of hunger. This is called "breastfeeding on demand." Signs that your baby is hungry include:  Increased alertness, activity, or restlessness.  Movement of the head from side to side.  Opening of the mouth when the corner of the mouth or cheek is stroked (rooting).  Increased sucking sounds, smacking lips, cooing, sighing, or squeaking.  Hand-to-mouth movements and sucking on fingers or hands.  Fussing or crying.  Avoid introducing a pacifier to your baby in the first 4-6 weeks after your baby is born. After this time, you may choose to use a pacifier. Research has shown that pacifier use during the first year of a baby's life decreases the risk of sudden infant death syndrome (SIDS). Allow your baby to feed on each breast as long as he or she  wants. When your baby unlatches or falls asleep while feeding from the first breast, offer the second breast. Because newborns are often sleepy in the first few weeks of life, you may need to awaken your baby to get him or her to feed. Breastfeeding times will vary from baby to baby. However, the following rules can serve as a guide to help you make sure that your baby is properly fed:  Newborns (babies 4 weeks of age or younger) may breastfeed every 1-3 hours.  Newborns should not go without breastfeeding for longer than 3 hours during the day or 5 hours during the night.  You should breastfeed your baby a minimum of 8 times in a 24-hour period.  Breast milk pumping Pumping and storing breast milk allows you to make sure that your baby is exclusively fed your breast milk, even at times when you are unable to breastfeed. This is especially important if you go back to work while you are still breastfeeding, or if you are not able to be present during feedings. Your lactation consultant can help you find a method of pumping that works best for you and give you guidelines about how long it is safe to store breast milk. Caring for your breasts while you breastfeed Nipples can become dry, cracked, and sore while breastfeeding. The following recommendations can help keep your breasts moisturized and healthy:  Avoid using soap on your nipples.  Wear a supportive bra designed especially for nursing. Avoid wearing underwire-style bras or extremely tight bras (sports bras).  Air-dry your nipples for 3-4 minutes after each feeding.  Use only cotton bra pads to absorb leaked breast milk. Leaking of breast milk between feedings is normal.    Use lanolin on your nipples after breastfeeding. Lanolin helps to maintain your skin's normal moisture barrier. Pure lanolin is not harmful (not toxic) to your baby. You may also hand express a few drops of breast milk and gently massage that milk into your nipples and  allow the milk to air-dry.  In the first few weeks after giving birth, some women experience breast engorgement. Engorgement can make your breasts feel heavy, warm, and tender to the touch. Engorgement peaks within 3-5 days after you give birth. The following recommendations can help to ease engorgement:  Completely empty your breasts while breastfeeding or pumping. You may want to start by applying warm, moist heat (in the shower or with warm, water-soaked hand towels) just before feeding or pumping. This increases circulation and helps the milk flow. If your baby does not completely empty your breasts while breastfeeding, pump any extra milk after he or she is finished.  Apply ice packs to your breasts immediately after breastfeeding or pumping, unless this is too uncomfortable for you. To do this: ? Put ice in a plastic bag. ? Place a towel between your skin and the bag. ? Leave the ice on for 20 minutes, 2-3 times a day.  Make sure that your baby is latched on and positioned properly while breastfeeding.  If engorgement persists after 48 hours of following these recommendations, contact your health care provider or a lactation consultant. Overall health care recommendations while breastfeeding  Eat 3 healthy meals and 3 snacks every day. Well-nourished mothers who are breastfeeding need an additional 450-500 calories a day. You can meet this requirement by increasing the amount of a balanced diet that you eat.  Drink enough water to keep your urine pale yellow or clear.  Rest often, relax, and continue to take your prenatal vitamins to prevent fatigue, stress, and low vitamin and mineral levels in your body (nutrient deficiencies).  Do not use any products that contain nicotine or tobacco, such as cigarettes and e-cigarettes. Your baby may be harmed by chemicals from cigarettes that pass into breast milk and exposure to secondhand smoke. If you need help quitting, ask your health care  provider.  Avoid alcohol.  Do not use illegal drugs or marijuana.  Talk with your health care provider before taking any medicines. These include over-the-counter and prescription medicines as well as vitamins and herbal supplements. Some medicines that may be harmful to your baby can pass through breast milk.  It is possible to become pregnant while breastfeeding. If birth control is desired, ask your health care provider about options that will be safe while breastfeeding your baby. Where to find more information: [employer]: www.llli.org Contact a health care provider if:  You feel like you want to stop breastfeeding or have become frustrated with breastfeeding.  Your nipples are cracked or bleeding.  Your breasts are red, tender, or warm.  You have: ? Painful breasts or nipples. ? A swollen area on either breast. ? A fever or chills. ? Nausea or vomiting. ? Drainage other than breast milk from your nipples.  Your breasts do not become full before feedings by the fifth day after you give birth.  You feel sad and depressed.  Your baby is: ? Too sleepy to eat well. ? Having trouble sleeping. ? More than 1 week old and wetting fewer than 6 diapers in a 24-hour period. ? Not gaining weight by 5 days of age.  Your baby has fewer than 3 stools in a   24-hour period.  Your baby's skin or the white parts of his or her eyes become yellow. Get help right away if:  Your baby is overly tired (lethargic) and does not want to wake up and feed.  Your baby develops an unexplained fever. Summary  Breastfeeding offers many health benefits for infant and mothers.  Try to breastfeed your infant when he or she shows early signs of hunger.  Gently tickle or stroke your baby's lips with your finger or nipple to allow the baby to open his or her mouth. Bring the baby to your breast. Make sure that much of the areola is in your baby's mouth. Offer one side and burp the  baby before you offer the other side.  Talk with your health care provider or lactation consultant if you have questions or you face problems as you breastfeed. This information is not intended to replace advice given to you by your health care provider. Make sure you discuss any questions you have with your health care provider. Document Released: 12/30/2004 Document Revised: 02/01/2016 Document Reviewed: 02/01/2016 Elsevier Interactive Patient Education  2018 Elsevier Inc.  

## 2017-07-22 NOTE — Progress Notes (Signed)
Pt presents for initial NOB visit. This is not a planned pregnancy and they do live together.

## 2017-07-22 NOTE — Progress Notes (Signed)
  Subjective:    Brittany Gaines is a G1P0 425w6d being seen today for her first obstetrical visit.  Her obstetrical history is significant for first unplanned pregnancy. Patient does intend to breast feed. Pregnancy history fully reviewed.  Patient reports no complaints.  Vitals:   07/22/17 1327  BP: 136/82  Pulse: 95  Weight: 163 lb 11.2 oz (74.3 kg)    HISTORY: OB History  Gravida Para Term Preterm AB Living  1            SAB TAB Ectopic Multiple Live Births               # Outcome Date GA Lbr Len/2nd Weight Sex Delivery Anes PTL Lv  1 Current            History reviewed. No pertinent past medical history. History reviewed. No pertinent surgical history. Family History  Problem Relation Age of Onset  . Birth defects Neg Hx   . Hearing loss Neg Hx   . Heart disease Neg Hx   . Miscarriages / Stillbirths Neg Hx      Exam    Uterus:   10-weeks in size  Pelvic Exam:    Perineum: No Hemorrhoids, Normal Perineum   Vulva: normal   Vagina:  normal mucosa, normal discharge   pH:    Cervix: nulliparous appearance   Adnexa: normal adnexa and no mass, fullness, tenderness   Bony Pelvis: gynecoid  System: Breast:  normal appearance, no masses or tenderness   Skin: normal coloration and turgor, no rashes    Neurologic: oriented, no focal deficits   Extremities: normal strength, tone, and muscle mass   HEENT extra ocular movement intact   Mouth/Teeth mucous membranes moist, pharynx normal without lesions and dental hygiene good   Neck supple and no masses   Cardiovascular: regular rate and rhythm   Respiratory:  appears well, vitals normal, no respiratory distress, acyanotic, normal RR, chest clear, no wheezing, crepitations, rhonchi, normal symmetric air entry   Abdomen: soft, non-tender; bowel sounds normal; no masses,  no organomegaly   Urinary:       Assessment:    Pregnancy: G1P0 Patient Active Problem List   Diagnosis Date Noted  . Supervision of normal first  pregnancy, antepartum 07/22/2017        Plan:     Initial labs drawn. Prenatal vitamins. Problem list reviewed and updated. Genetic Screening discussed : Panorama ordered.  Ultrasound discussed; fetal survey: requested. Patient enrolled in BRx app  Follow up in 4 weeks. 50% of 30 min visit spent on counseling and coordination of care.     Jeramy Dimmick 07/22/2017

## 2017-07-23 LAB — CYTOLOGY - PAP
Chlamydia: NEGATIVE
Diagnosis: NEGATIVE
Neisseria Gonorrhea: NEGATIVE
Trichomonas: NEGATIVE

## 2017-07-24 LAB — OBSTETRIC PANEL, INCLUDING HIV
Antibody Screen: NEGATIVE
BASOS ABS: 0 10*3/uL (ref 0.0–0.2)
Basos: 0 %
EOS (ABSOLUTE): 0 10*3/uL (ref 0.0–0.4)
Eos: 0 %
HEP B S AG: NEGATIVE
HIV Screen 4th Generation wRfx: NONREACTIVE
Hematocrit: 42.2 % (ref 34.0–46.6)
Hemoglobin: 13.8 g/dL (ref 11.1–15.9)
Immature Grans (Abs): 0 10*3/uL (ref 0.0–0.1)
Immature Granulocytes: 0 %
Lymphocytes Absolute: 1.6 10*3/uL (ref 0.7–3.1)
Lymphs: 19 %
MCH: 30.3 pg (ref 26.6–33.0)
MCHC: 32.7 g/dL (ref 31.5–35.7)
MCV: 93 fL (ref 79–97)
Monocytes Absolute: 0.6 10*3/uL (ref 0.1–0.9)
Monocytes: 7 %
Neutrophils Absolute: 6.4 10*3/uL (ref 1.4–7.0)
Neutrophils: 74 %
PLATELETS: 229 10*3/uL (ref 150–450)
RBC: 4.56 x10E6/uL (ref 3.77–5.28)
RDW: 13 % (ref 12.3–15.4)
RPR: NONREACTIVE
Rh Factor: POSITIVE
Rubella Antibodies, IGG: <0.9 index — ABNORMAL LOW (ref >0.99)
WBC: 8.7 10*3/uL (ref 3.4–10.8)

## 2017-07-24 LAB — HEMOGLOBINOPATHY EVALUATION
HEMOGLOBIN A2 QUANTITATION: 2 % (ref 1.8–3.2)
HEMOGLOBIN F QUANTITATION: 0 % (ref 0.0–2.0)
HGB C: 0 %
HGB S: 0 %
HGB VARIANT: 0 %
Hgb A: 98 % (ref 96.4–98.8)

## 2017-07-24 LAB — URINE CULTURE, OB REFLEX: Organism ID, Bacteria: NO GROWTH

## 2017-07-24 LAB — CULTURE, OB URINE

## 2017-07-28 ENCOUNTER — Encounter: Payer: Self-pay | Admitting: *Deleted

## 2017-07-29 LAB — SMN1 COPY NUMBER ANALYSIS (SMA CARRIER SCREENING)

## 2017-07-29 LAB — CYSTIC FIBROSIS MUTATION 97: Interpretation: NOT DETECTED

## 2017-08-12 ENCOUNTER — Encounter: Payer: Self-pay | Admitting: Obstetrics and Gynecology

## 2017-08-12 DIAGNOSIS — Z789 Other specified health status: Secondary | ICD-10-CM | POA: Insufficient documentation

## 2017-08-19 ENCOUNTER — Encounter: Payer: Self-pay | Admitting: Obstetrics and Gynecology

## 2017-08-19 ENCOUNTER — Ambulatory Visit (INDEPENDENT_AMBULATORY_CARE_PROVIDER_SITE_OTHER): Payer: Medicaid Other | Admitting: Obstetrics and Gynecology

## 2017-08-19 VITALS — BP 138/82 | HR 80 | Wt 164.8 lb

## 2017-08-19 DIAGNOSIS — Z3400 Encounter for supervision of normal first pregnancy, unspecified trimester: Secondary | ICD-10-CM

## 2017-08-19 NOTE — Progress Notes (Signed)
   PRENATAL VISIT NOTE  Subjective:  Brittany Gaines is a 22 y.o. G1P0 at 263w6d being seen today for ongoing prenatal care.  She is currently monitored for the following issues for this low-risk pregnancy and has Supervision of normal first pregnancy, antepartum and Not immune to rubella on their problem list.  Patient reports some headaches.  Contractions: Not present. Vag. Bleeding: None.  Movement: Absent. Denies leaking of fluid.   The following portions of the patient's history were reviewed and updated as appropriate: allergies, current medications, past family history, past medical history, past social history, past surgical history and problem list. Problem list updated.  Objective:   Vitals:   08/19/17 1613  BP: 138/82  Pulse: 80  Weight: 164 lb 12.8 oz (74.8 kg)    Fetal Status: Fetal Heart Rate (bpm): + on sono   Movement: Absent     General:  Alert, oriented and cooperative. Patient is in no acute distress.  Skin: Skin is warm and dry. No rash noted.   Cardiovascular: Normal heart rate noted  Respiratory: Normal respiratory effort, no problems with respiration noted  Abdomen: Soft, gravid, appropriate for gestational age.  Pain/Pressure: Absent     Pelvic: Cervical exam deferred        Extremities: Normal range of motion.     Mental Status: Normal mood and affect. Normal behavior. Normal judgment and thought content.   Assessment and Plan:  Pregnancy: G1P0 at 363w6d  1. Supervision of normal first pregnancy, antepartum Patient is doing well Encouraged patient to increase hydration and to take tylenol as needed Anatomy ultrasound ordered - US MFM OB COMP + 14 WK; Future  Preterm labor symptoms and general obstetric precautions including but not limited to vaginal bleeding, contractions, leaking of fluid and fetal movement were reviewed in detail with the patient. Please refer to After Visit Summary for other counseling recommendations.  Return in about 1 month  (around 09/16/2017) for ROB.  No future appointments.  Catalina AntiguaPeggy Lawayne Hartig, MD

## 2017-08-19 NOTE — Progress Notes (Signed)
Pt is G1P0 1766w6d here for ROB. Pt is complaining of HA for the past couple weeks off and on. Pt denies any visual changes or swelling.

## 2017-09-17 ENCOUNTER — Encounter: Payer: Self-pay | Admitting: Obstetrics and Gynecology

## 2017-09-17 ENCOUNTER — Ambulatory Visit (INDEPENDENT_AMBULATORY_CARE_PROVIDER_SITE_OTHER): Payer: Medicaid Other | Admitting: Obstetrics and Gynecology

## 2017-09-17 ENCOUNTER — Encounter (HOSPITAL_COMMUNITY): Payer: Self-pay

## 2017-09-17 VITALS — BP 137/79 | HR 80 | Wt 170.2 lb

## 2017-09-17 DIAGNOSIS — Z789 Other specified health status: Secondary | ICD-10-CM

## 2017-09-17 DIAGNOSIS — Z3400 Encounter for supervision of normal first pregnancy, unspecified trimester: Secondary | ICD-10-CM

## 2017-09-17 NOTE — Progress Notes (Signed)
ROB Scheduled anatomy 09/25/10 Pt has a few  concerns today.  Pt states she nervous at every visit believes causing B/P to be elevated. Pt has a list of concerns for provider.

## 2017-09-17 NOTE — Progress Notes (Signed)
   PRENATAL VISIT NOTE  Subjective:  Brittany Gaines is a 22 y.o. G1P0 at [redacted]w[redacted]d being seen today for ongoing prenatal care.  She is currently monitored for the following issues for this low-risk pregnancy and has Supervision of normal first pregnancy, antepartum and Not immune to rubella on their problem list.  Patient reports no complaints.  Contractions: Not present. Vag. Bleeding: None.  Movement: Present. Denies leaking of fluid.   The following portions of the patient's history were reviewed and updated as appropriate: allergies, current medications, past family history, past medical history, past social history, past surgical history and problem list. Problem list updated.  Objective:   Vitals:   09/17/17 1356  BP: 137/79  Pulse: 80  Weight: 170 lb 3.2 oz (77.2 kg)    Fetal Status: Fetal Heart Rate (bpm): 150   Movement: Present     General:  Alert, oriented and cooperative. Patient is in no acute distress.  Skin: Skin is warm and dry. No rash noted.   Cardiovascular: Normal heart rate noted  Respiratory: Normal respiratory effort, no problems with respiration noted  Abdomen: Soft, gravid, appropriate for gestational age.  Pain/Pressure: Present     Pelvic: Cervical exam deferred        Extremities: Normal range of motion.  Edema: None  Mental Status: Normal mood and affect. Normal behavior. Normal judgment and thought content.   Assessment and Plan:  Pregnancy: G1P0 at [redacted]w[redacted]d  1. Supervision of normal first pregnancy, antepartum Patient is doing well without complaints Anatomy ultrasound on 9/12 AFP today  2. Not immune to rubella Will offer pp  Preterm labor symptoms and general obstetric precautions including but not limited to vaginal bleeding, contractions, leaking of fluid and fetal movement were reviewed in detail with the patient. Please refer to After Visit Summary for other counseling recommendations.  No follow-ups on file.  Future Appointments  Date Time  Provider Department Center  09/17/2017  4:00 PM Sharel Behne, Gigi Gin, MD [facility] None  09/24/2017  1:30 PM [facility] [place] [facility]    Catalina Antigua, MD

## 2017-09-19 LAB — AFP, SERUM, OPEN SPINA BIFIDA
AFP MoM: 0.86
AFP Value: 33.4 ng/mL
GEST. AGE ON COLLECTION DATE: 18 wk
MATERNAL AGE AT EDD: 24 a
OSBR Risk 1 IN: 10000
Test Results:: NEGATIVE
WEIGHT: 170 [lb_av]

## 2017-09-23 ENCOUNTER — Encounter (HOSPITAL_COMMUNITY): Payer: Self-pay

## 2017-09-24 ENCOUNTER — Encounter (HOSPITAL_COMMUNITY): Payer: Self-pay

## 2017-09-24 ENCOUNTER — Ambulatory Visit (HOSPITAL_COMMUNITY)
Admission: RE | Admit: 2017-09-24 | Discharge: 2017-09-24 | Disposition: A | Discharge status: Home / Self Care | Payer: Medicaid Other | Source: Ambulatory Visit | Attending: Obstetrics and Gynecology | Admitting: Obstetrics and Gynecology

## 2017-09-24 DIAGNOSIS — Z3A19 19 weeks gestation of pregnancy: Secondary | ICD-10-CM | POA: Diagnosis not present

## 2017-09-24 DIAGNOSIS — Z3482 Encounter for supervision of other normal pregnancy, second trimester: Secondary | ICD-10-CM | POA: Diagnosis not present

## 2017-09-24 DIAGNOSIS — Z3400 Encounter for supervision of normal first pregnancy, unspecified trimester: Secondary | ICD-10-CM | POA: Diagnosis present

## 2017-09-24 DIAGNOSIS — Z363 Encounter for antenatal screening for malformations: Secondary | ICD-10-CM

## 2017-10-15 ENCOUNTER — Encounter: Payer: Self-pay | Admitting: Obstetrics and Gynecology

## 2017-10-15 ENCOUNTER — Ambulatory Visit (INDEPENDENT_AMBULATORY_CARE_PROVIDER_SITE_OTHER): Payer: Medicaid Other | Admitting: Obstetrics and Gynecology

## 2017-10-15 VITALS — BP 133/91 | HR 84 | Wt 179.0 lb

## 2017-10-15 DIAGNOSIS — Z3400 Encounter for supervision of normal first pregnancy, unspecified trimester: Secondary | ICD-10-CM

## 2017-10-15 DIAGNOSIS — Z23 Encounter for immunization: Secondary | ICD-10-CM | POA: Diagnosis not present

## 2017-10-15 DIAGNOSIS — Z3402 Encounter for supervision of normal first pregnancy, second trimester: Secondary | ICD-10-CM

## 2017-10-15 DIAGNOSIS — Z789 Other specified health status: Secondary | ICD-10-CM

## 2017-10-15 NOTE — Progress Notes (Signed)
Pt is here for ROB. G1P0 [redacted]w[redacted]d. Flu vaccine given today.

## 2017-10-15 NOTE — Progress Notes (Signed)
   PRENATAL VISIT NOTE  Subjective:  Brittany Gaines is a 22 y.o. G1P0 at 62w0dbeing seen today for ongoing prenatal care.  She is currently monitored for the following issues for this low-risk pregnancy and has Supervision of normal first pregnancy, antepartum and Not immune to rubella on their problem list.  Patient reports no complaints.  Contractions: Not present. Vag. Bleeding: None.  Movement: Present. Denies leaking of fluid.   The following portions of the patient's history were reviewed and updated as appropriate: allergies, current medications, past family history, past medical history, past social history, past surgical history and problem list. Problem list updated.  Objective:   Vitals:   10/15/17 1427  BP: (!) 133/91  Pulse: 84  Weight: 179 lb (81.2 kg)    Fetal Status: Fetal Heart Rate (bpm): 148   Movement: Present     General:  Alert, oriented and cooperative. Patient is in no acute distress.  Skin: Skin is warm and dry. No rash noted.   Cardiovascular: Normal heart rate noted  Respiratory: Normal respiratory effort, no problems with respiration noted  Abdomen: Soft, gravid, appropriate for gestational age.  Pain/Pressure: Absent     Pelvic: Cervical exam deferred        Extremities: Normal range of motion.  Edema: None  Mental Status: Normal mood and affect. Normal behavior. Normal judgment and thought content.   Assessment and Plan:  Pregnancy: G1P0 at 222w0d1. Supervision of normal first pregnancy, antepartum - Flu Vaccine QUAD 36+ mos IM (Fluarix, Quad PF)  2. Not immune to rubella MMR pp  Preterm labor symptoms and general obstetric precautions including but not limited to vaginal bleeding, contractions, leaking of fluid and fetal movement were reviewed in detail with the patient. Please refer to After Visit Summary for other counseling recommendations.  Return in about 4 weeks (around 11/12/2017) for OB visit.  No future appointments.  KeSloan LeiterMD

## 2017-10-22 ENCOUNTER — Telehealth: Payer: Self-pay

## 2017-10-22 NOTE — Telephone Encounter (Signed)
Left VM for patient to follow up from mychart messages yesterday. Advised pt to call back to let us know how she's feeling.

## 2017-11-12 ENCOUNTER — Encounter: Payer: Self-pay | Admitting: Obstetrics and Gynecology

## 2017-11-12 ENCOUNTER — Ambulatory Visit (INDEPENDENT_AMBULATORY_CARE_PROVIDER_SITE_OTHER): Payer: Medicaid Other | Admitting: Obstetrics and Gynecology

## 2017-11-12 ENCOUNTER — Other Ambulatory Visit: Payer: Self-pay

## 2017-11-12 VITALS — BP 137/90 | HR 97 | Wt 186.9 lb

## 2017-11-12 DIAGNOSIS — R030 Elevated blood-pressure reading, without diagnosis of hypertension: Secondary | ICD-10-CM

## 2017-11-12 DIAGNOSIS — Z3400 Encounter for supervision of normal first pregnancy, unspecified trimester: Secondary | ICD-10-CM

## 2017-11-12 DIAGNOSIS — Z789 Other specified health status: Secondary | ICD-10-CM

## 2017-11-12 DIAGNOSIS — O139 Gestational [pregnancy-induced] hypertension without significant proteinuria, unspecified trimester: Secondary | ICD-10-CM | POA: Insufficient documentation

## 2017-11-12 NOTE — Progress Notes (Signed)
   PRENATAL VISIT NOTE  Subjective:  Brittany Gaines is a 22 y.o. G1P0 at 40w0dbeing seen today for ongoing prenatal care.  She is currently monitored for the following issues for this low-risk pregnancy and has Supervision of normal first pregnancy, antepartum; Not immune to rubella; and Elevated blood pressure reading in office without diagnosis of hypertension on their problem list.  Patient reports occasional pain under right ribs.  Contractions: Not present. Vag. Bleeding: None.  Movement: Present. Denies leaking of fluid.   The following portions of the patient's history were reviewed and updated as appropriate: allergies, current medications, past family history, past medical history, past social history, past surgical history and problem list. Problem list updated.  Objective:   Vitals:   11/12/17 1535  BP: 137/90  Pulse: 97  Weight: 186 lb 14.4 oz (84.8 kg)    Fetal Status: Fetal Heart Rate (bpm): 159   Movement: Present     General:  Alert, oriented and cooperative. Patient is in no acute distress.  Skin: Skin is warm and dry. No rash noted.   Cardiovascular: Normal heart rate noted  Respiratory: Normal respiratory effort, no problems with respiration noted  Abdomen: Soft, gravid, appropriate for gestational age.  Pain/Pressure: Absent     Pelvic: Cervical exam deferred        Extremities: Normal range of motion.  Edema: None  Mental Status: Normal mood and affect. Normal behavior. Normal judgment and thought content.   Assessment and Plan:  Pregnancy: G1P0 at 251w0d1. Supervision of normal first pregnancy, antepartum  2. Not immune to rubella MMR pp  3. Elevated blood pressure reading in office without diagnosis of hypertension Diastolic > 90 x2 starting 22 weeks Likely gestational hypertension - CBC - Comp Met (CMET) - Protein / creatinine ratio, urine   Preterm labor symptoms and general obstetric precautions including but not limited to vaginal bleeding,  contractions, leaking of fluid and fetal movement were reviewed in detail with the patient. Please refer to After Visit Summary for other counseling recommendations.  Return in about 2 weeks (around 11/26/2017) for 3rd trim labs, 2 hr GTT, OB visit (MD).  No future appointments.  KeSloan LeiterMD

## 2017-11-12 NOTE — Progress Notes (Signed)
ROB.  C/o pain under right ribs

## 2017-11-13 LAB — CBC
HEMATOCRIT: 33.1 % — AB (ref 34.0–46.6)
Hemoglobin: 11.5 g/dL (ref 11.1–15.9)
MCH: 31.9 pg (ref 26.6–33.0)
MCHC: 34.7 g/dL (ref 31.5–35.7)
MCV: 92 fL (ref 79–97)
Platelets: 226 10*3/uL (ref 150–450)
RBC: 3.6 x10E6/uL — ABNORMAL LOW (ref 3.77–5.28)
RDW: 12 % — ABNORMAL LOW (ref 12.3–15.4)
WBC: 10.6 10*3/uL (ref 3.4–10.8)

## 2017-11-13 LAB — COMPREHENSIVE METABOLIC PANEL
ALT: 8 IU/L (ref 0–32)
AST: 9 IU/L (ref 0–40)
Albumin/Globulin Ratio: 1.5 (ref 1.2–2.2)
Albumin: 3.8 g/dL (ref 3.5–5.5)
Alkaline Phosphatase: 69 IU/L (ref 39–117)
BUN/Creatinine Ratio: 19 (ref 9–23)
BUN: 12 mg/dL (ref 6–20)
Bilirubin Total: <0.2 mg/dL (ref 0.0–1.2)
CALCIUM: 9 mg/dL (ref 8.7–10.2)
CO2: 20 mmol/L (ref 20–29)
CREATININE: 0.63 mg/dL (ref 0.57–1.00)
Chloride: 103 mmol/L (ref 96–106)
GFR calc Af Amer: 146 mL/min/{1.73_m2} (ref >59)
GFR, EST NON AFRICAN AMERICAN: 127 mL/min/{1.73_m2} (ref >59)
Globulin, Total: 2.5 g/dL (ref 1.5–4.5)
Glucose: 77 mg/dL (ref 65–99)
Potassium: 3.9 mmol/L (ref 3.5–5.2)
Sodium: 136 mmol/L (ref 134–144)
Total Protein: 6.3 g/dL (ref 6.0–8.5)

## 2017-11-13 LAB — PROTEIN / CREATININE RATIO, URINE
Creatinine, Urine: 200.1 mg/dL
PROTEIN/CREAT RATIO: 106 mg/g{creat} (ref 0–200)
Protein, Ur: 21.2 mg/dL

## 2017-11-27 ENCOUNTER — Encounter: Payer: Self-pay | Admitting: Obstetrics and Gynecology

## 2017-11-27 ENCOUNTER — Other Ambulatory Visit: Payer: Medicaid Other

## 2017-11-27 ENCOUNTER — Ambulatory Visit (INDEPENDENT_AMBULATORY_CARE_PROVIDER_SITE_OTHER): Payer: Medicaid Other | Admitting: Obstetrics and Gynecology

## 2017-11-27 VITALS — BP 129/82 | HR 108 | Wt 194.0 lb

## 2017-11-27 DIAGNOSIS — Z3403 Encounter for supervision of normal first pregnancy, third trimester: Secondary | ICD-10-CM | POA: Diagnosis not present

## 2017-11-27 DIAGNOSIS — O133 Gestational [pregnancy-induced] hypertension without significant proteinuria, third trimester: Secondary | ICD-10-CM

## 2017-11-27 DIAGNOSIS — Z23 Encounter for immunization: Secondary | ICD-10-CM | POA: Diagnosis not present

## 2017-11-27 DIAGNOSIS — O139 Gestational [pregnancy-induced] hypertension without significant proteinuria, unspecified trimester: Secondary | ICD-10-CM

## 2017-11-27 DIAGNOSIS — Z3400 Encounter for supervision of normal first pregnancy, unspecified trimester: Secondary | ICD-10-CM

## 2017-11-27 NOTE — Progress Notes (Signed)
Pt is here for ROB and GTT. G1P0 8511w1d.

## 2017-11-27 NOTE — Progress Notes (Signed)
   PRENATAL VISIT NOTE  Subjective:  Brittany Gaines is a 22 y.o. G1P0 at 7788w1d being seen today for ongoing prenatal care.  She is currently monitored for the following issues for this high-risk pregnancy and has Supervision of normal first pregnancy, antepartum; Not immune to rubella; and Gestational hypertension without significant proteinuria, antepartum on their problem list.  Patient reports no complaints.  Contractions: Not present. Vag. Bleeding: None.  Movement: Present. Denies leaking of fluid.   The following portions of the patient's history were reviewed and updated as appropriate: allergies, current medications, past family history, past medical history, past social history, past surgical history and problem list. Problem list updated.  Objective:   Vitals:   11/27/17 0914  BP: 129/82  Pulse: (!) 108  Weight: 194 lb (88 kg)    Fetal Status:     Movement: Present     General:  Alert, oriented and cooperative. Patient is in no acute distress.  Skin: Skin is warm and dry. No rash noted.   Cardiovascular: Normal heart rate noted  Respiratory: Normal respiratory effort, no problems with respiration noted  Abdomen: Soft, gravid, appropriate for gestational age.  Pain/Pressure: Present     Pelvic: Cervical exam deferred        Extremities: Normal range of motion.     Mental Status: Normal mood and affect. Normal behavior. Normal judgment and thought content.   Assessment and Plan:  Pregnancy: G1P0 at 5888w1d  1. Supervision of normal first pregnancy, antepartum Patient is doing well Third trimester labs today - Glucose Tolerance, 2 Hours w/1 Hour - CBC - RPR - HIV Antibody (routine testing w rflx)  2. Gestational hypertension without significant proteinuria, antepartum Labs reviewed Will continue to monitor closely  Preterm labor symptoms and general obstetric precautions including but not limited to vaginal bleeding, contractions, leaking of fluid and fetal movement  were reviewed in detail with the patient. Please refer to After Visit Summary for other counseling recommendations.  Return in about 2 weeks (around 12/11/2017) for ROB.  Future Appointments  Date Time Provider Department Center  11/27/2017 10:30 AM Xion Debruyne, Gigi GinPeggy, MD [facility] None    Catalina AntiguaPeggy Sahir Tolson, MD

## 2017-11-27 NOTE — Patient Instructions (Signed)
Contraception Choices Contraception, also called birth control, refers to methods or devices that prevent pregnancy. Hormonal methods Contraceptive implant A contraceptive implant is a thin, plastic tube that contains a hormone. It is inserted into the upper part of the arm. It can remain in place for up to 3 years. Progestin-only injections Progestin-only injections are injections of progestin, a synthetic form of the hormone progesterone. They are given every 3 months by a health care provider. Birth control pills Birth control pills are pills that contain hormones that prevent pregnancy. They must be taken once a day, preferably at the same time each day. Birth control patch The birth control patch contains hormones that prevent pregnancy. It is placed on the skin and must be changed once a week for three weeks and removed on the fourth week. A prescription is needed to use this method of contraception. Vaginal ring A vaginal ring contains hormones that prevent pregnancy. It is placed in the vagina for three weeks and removed on the fourth week. After that, the process is repeated with a new ring. A prescription is needed to use this method of contraception. Emergency contraceptive Emergency contraceptives prevent pregnancy after unprotected sex. They come in pill form and can be taken up to 5 days after sex. They work best the sooner they are taken after having sex. Most emergency contraceptives are available without a prescription. This method should not be used as your only form of birth control. Barrier methods Female condom A female condom is a thin sheath that is worn over the penis during sex. Condoms keep sperm from going inside a woman's body. They can be used with a spermicide to increase their effectiveness. They should be disposed after a single use. Female condom A female condom is a soft, loose-fitting sheath that is put into the vagina before sex. The condom keeps sperm from going  inside a woman's body. They should be disposed after a single use. Diaphragm A diaphragm is a soft, dome-shaped barrier. It is inserted into the vagina before sex, along with a spermicide. The diaphragm blocks sperm from entering the uterus, and the spermicide kills sperm. A diaphragm should be left in the vagina for 6-8 hours after sex and removed within 24 hours. A diaphragm is prescribed and fitted by a health care provider. A diaphragm should be replaced every 1-2 years, after giving birth, after gaining more than 15 lb (6.8 kg), and after pelvic surgery. Cervical cap A cervical cap is a round, soft latex or plastic cup that fits over the cervix. It is inserted into the vagina before sex, along with spermicide. It blocks sperm from entering the uterus. The cap should be left in place for 6-8 hours after sex and removed within 48 hours. A cervical cap must be prescribed and fitted by a health care provider. It should be replaced every 2 years. Sponge A sponge is a soft, circular piece of polyurethane foam with spermicide on it. The sponge helps block sperm from entering the uterus, and the spermicide kills sperm. To use it, you make it wet and then insert it into the vagina. It should be inserted before sex, left in for at least 6 hours after sex, and removed and thrown away within 30 hours. Spermicides Spermicides are chemicals that kill or block sperm from entering the cervix and uterus. They can come as a cream, jelly, suppository, foam, or tablet. A spermicide should be inserted into the vagina with an applicator at least 10-15 minutes before   sex to allow time for it to work. The process must be repeated every time you have sex. Spermicides do not require a prescription. Intrauterine contraception Intrauterine device (IUD) An IUD is a T-shaped device that is put in a woman's uterus. There are two types:  Hormone IUD.This type contains progestin, a synthetic form of the hormone progesterone. This  type can stay in place for 3-5 years.  Copper IUD.This type is wrapped in copper wire. It can stay in place for 10 years.  Permanent methods of contraception Female tubal ligation In this method, a woman's fallopian tubes are sealed, tied, or blocked during surgery to prevent eggs from traveling to the uterus. Hysteroscopic sterilization In this method, a small, flexible insert is placed into each fallopian tube. The inserts cause scar tissue to form in the fallopian tubes and block them, so sperm cannot reach an egg. The procedure takes about 3 months to be effective. Another form of birth control must be used during those 3 months. Female sterilization This is a procedure to tie off the tubes that carry sperm (vasectomy). After the procedure, the man can still ejaculate fluid (semen). Natural planning methods Natural family planning In this method, a couple does not have sex on days when the woman could become pregnant. Calendar method This means keeping track of the length of each menstrual cycle, identifying the days when pregnancy can happen, and not having sex on those days. Ovulation method In this method, a couple avoids sex during ovulation. Symptothermal method This method involves not having sex during ovulation. The woman typically checks for ovulation by watching changes in her temperature and in the consistency of cervical mucus. Post-ovulation method In this method, a couple waits to have sex until after ovulation. Summary  Contraception, also called birth control, means methods or devices that prevent pregnancy.  Hormonal methods of contraception include implants, injections, pills, patches, vaginal rings, and emergency contraceptives.  Barrier methods of contraception can include female condoms, female condoms, diaphragms, cervical caps, sponges, and spermicides.  There are two types of IUDs (intrauterine devices). An IUD can be put in a woman's uterus to prevent pregnancy  for 3-5 years.  Permanent sterilization can be done through a procedure for males, females, or both.  Natural family planning methods involve not having sex on days when the woman could become pregnant. This information is not intended to replace advice given to you by your health care provider. Make sure you discuss any questions you have with your health care provider. Document Released: 12/30/2004 Document Revised: 02/02/2016 Document Reviewed: 02/02/2016 Elsevier Interactive Patient Education  2018 [employer]  

## 2017-11-28 LAB — CBC
Hematocrit: 33.2 % — ABNORMAL LOW (ref 34.0–46.6)
Hemoglobin: 11.6 g/dL (ref 11.1–15.9)
MCH: 31.8 pg (ref 26.6–33.0)
MCHC: 34.9 g/dL (ref 31.5–35.7)
MCV: 91 fL (ref 79–97)
Platelets: 224 10*3/uL (ref 150–450)
RBC: 3.65 x10E6/uL — ABNORMAL LOW (ref 3.77–5.28)
RDW: 11.8 % — AB (ref 12.3–15.4)
WBC: 8.2 10*3/uL (ref 3.4–10.8)

## 2017-11-28 LAB — RPR: RPR: NONREACTIVE

## 2017-11-28 LAB — GLUCOSE TOLERANCE, 2 HOURS W/ 1HR
GLUCOSE, 1 HOUR: 150 mg/dL (ref 65–179)
Glucose, 2 hour: 106 mg/dL (ref 65–152)
Glucose, Fasting: 77 mg/dL (ref 65–91)

## 2017-11-28 LAB — HIV ANTIBODY (ROUTINE TESTING W REFLEX): HIV SCREEN 4TH GENERATION: NONREACTIVE

## 2017-12-15 ENCOUNTER — Encounter: Payer: Medicaid Other | Admitting: Obstetrics & Gynecology

## 2017-12-15 ENCOUNTER — Ambulatory Visit (INDEPENDENT_AMBULATORY_CARE_PROVIDER_SITE_OTHER): Payer: Medicaid Other | Admitting: Obstetrics & Gynecology

## 2017-12-15 VITALS — BP 134/73 | HR 78 | Wt 201.8 lb

## 2017-12-15 DIAGNOSIS — Z3400 Encounter for supervision of normal first pregnancy, unspecified trimester: Secondary | ICD-10-CM

## 2017-12-15 DIAGNOSIS — O139 Gestational [pregnancy-induced] hypertension without significant proteinuria, unspecified trimester: Secondary | ICD-10-CM

## 2017-12-15 NOTE — Patient Instructions (Signed)

## 2017-12-15 NOTE — Progress Notes (Signed)
   PRENATAL VISIT NOTE  Subjective:  Brittany Gaines is a 22 y.o. G1P0 at 9042w5d being seen today for ongoing prenatal care.  She is currently monitored for the following issues for this high-risk pregnancy and has Supervision of normal first pregnancy, antepartum; Not immune to rubella; and Gestational hypertension without significant proteinuria, antepartum on their problem list.  Patient reports no complaints.  Contractions: Not present. Vag. Bleeding: None.  Movement: Present. Denies leaking of fluid.   The following portions of the patient's history were reviewed and updated as appropriate: allergies, current medications, past family history, past medical history, past social history, past surgical history and problem list. Problem list updated.  Objective:   Vitals:   12/15/17 1412  BP: 134/73  Pulse: 78  Weight: 201 lb 12.8 oz (91.5 kg)    Fetal Status: Fetal Heart Rate (bpm): 158   Movement: Present     General:  Alert, oriented and cooperative. Patient is in no acute distress.  Skin: Skin is warm and dry. No rash noted.   Cardiovascular: Normal heart rate noted  Respiratory: Normal respiratory effort, no problems with respiration noted  Abdomen: Soft, gravid, appropriate for gestational age.  Pain/Pressure: Present     Pelvic: Cervical exam deferred        Extremities: Normal range of motion.  Edema: Trace  Mental Status: Normal mood and affect. Normal behavior. Normal judgment and thought content.   Assessment and Plan:  Pregnancy: G1P0 at 1042w5d  1. Supervision of normal first pregnancy, antepartum Gest HTN  2. Gestational hypertension without significant proteinuria, antepartum U/S f/u and start fetal surveillance next week - US MFM OB FOLLOW UP; Future  Preterm labor symptoms and general obstetric precautions including but not limited to vaginal bleeding, contractions, leaking of fluid and fetal movement were reviewed in detail with the patient. Please refer to  After Visit Summary for other counseling recommendations.  Return in about 2 weeks (around 12/29/2017).  No future appointments.  Scheryl DarterJames Nancy Manuele, MD

## 2017-12-15 NOTE — Progress Notes (Signed)
Pt is here for ROB. G1P0 7124w5d.

## 2017-12-22 ENCOUNTER — Ambulatory Visit (HOSPITAL_COMMUNITY)
Admission: RE | Admit: 2017-12-22 | Discharge: 2017-12-22 | Disposition: A | Discharge status: Home / Self Care | Payer: Medicaid Other | Source: Ambulatory Visit | Attending: Obstetrics & Gynecology | Admitting: Obstetrics & Gynecology

## 2017-12-22 ENCOUNTER — Other Ambulatory Visit: Payer: Self-pay | Admitting: Obstetrics & Gynecology

## 2017-12-22 DIAGNOSIS — O133 Gestational [pregnancy-induced] hypertension without significant proteinuria, third trimester: Secondary | ICD-10-CM | POA: Insufficient documentation

## 2017-12-22 DIAGNOSIS — O139 Gestational [pregnancy-induced] hypertension without significant proteinuria, unspecified trimester: Secondary | ICD-10-CM

## 2017-12-22 DIAGNOSIS — Z3A31 31 weeks gestation of pregnancy: Secondary | ICD-10-CM | POA: Insufficient documentation

## 2017-12-23 ENCOUNTER — Other Ambulatory Visit (HOSPITAL_COMMUNITY): Payer: Self-pay | Admitting: *Deleted

## 2017-12-23 DIAGNOSIS — O133 Gestational [pregnancy-induced] hypertension without significant proteinuria, third trimester: Secondary | ICD-10-CM

## 2017-12-29 ENCOUNTER — Ambulatory Visit (HOSPITAL_COMMUNITY)
Admission: RE | Admit: 2017-12-29 | Discharge: 2017-12-29 | Disposition: A | Discharge status: Home / Self Care | Payer: Medicaid Other | Source: Ambulatory Visit | Attending: Obstetrics & Gynecology | Admitting: Obstetrics & Gynecology

## 2017-12-29 ENCOUNTER — Ambulatory Visit (INDEPENDENT_AMBULATORY_CARE_PROVIDER_SITE_OTHER): Payer: Medicaid Other | Admitting: Obstetrics & Gynecology

## 2017-12-29 VITALS — BP 125/83 | HR 97 | Wt 202.7 lb

## 2017-12-29 DIAGNOSIS — O139 Gestational [pregnancy-induced] hypertension without significant proteinuria, unspecified trimester: Secondary | ICD-10-CM

## 2017-12-29 DIAGNOSIS — Z3400 Encounter for supervision of normal first pregnancy, unspecified trimester: Secondary | ICD-10-CM

## 2017-12-29 DIAGNOSIS — Z3A32 32 weeks gestation of pregnancy: Secondary | ICD-10-CM | POA: Insufficient documentation

## 2017-12-29 DIAGNOSIS — Z3403 Encounter for supervision of normal first pregnancy, third trimester: Secondary | ICD-10-CM

## 2017-12-29 DIAGNOSIS — O133 Gestational [pregnancy-induced] hypertension without significant proteinuria, third trimester: Secondary | ICD-10-CM | POA: Insufficient documentation

## 2017-12-29 NOTE — Progress Notes (Signed)
   PRENATAL VISIT NOTE  Subjective:  Brittany Gaines is a 22 y.o. G1P0 at 3863w5d being seen today for ongoing prenatal care.  She is currently monitored for the following issues for this high-risk pregnancy and has Supervision of normal first pregnancy, antepartum; Not immune to rubella; and Gestational hypertension without significant proteinuria, antepartum on their problem list.  Patient reports no complaints.  Contractions: Not present. Vag. Bleeding: None.  Movement: Present. Denies leaking of fluid.   The following portions of the patient's history were reviewed and updated as appropriate: allergies, current medications, past family history, past medical history, past social history, past surgical history and problem list. Problem list updated.  Objective:   Vitals:   12/29/17 1321  BP: 125/83  Pulse: 97  Weight: 202 lb 11.2 oz (91.9 kg)    Fetal Status: Fetal Heart Rate (bpm): 145   Movement: Present     General:  Alert, oriented and cooperative. Patient is in no acute distress.  Skin: Skin is warm and dry. No rash noted.   Cardiovascular: Normal heart rate noted  Respiratory: Normal respiratory effort, no problems with respiration noted  Abdomen: Soft, gravid, appropriate for gestational age.  Pain/Pressure: Present     Pelvic: Cervical exam deferred        Extremities: Normal range of motion.  Edema: Trace  Mental Status: Normal mood and affect. Normal behavior. Normal judgment and thought content.   Assessment and Plan:  Pregnancy: G1P0 at 2363w5d  1. Supervision of normal first pregnancy, antepartum Nl growth  2. Gestational hypertension without significant proteinuria, antepartum BP stable   Preterm labor symptoms and general obstetric precautions including but not limited to vaginal bleeding, contractions, leaking of fluid and fetal movement were reviewed in detail with the patient. Please refer to After Visit Summary for other counseling recommendations.  Return in  about 2 weeks (around 01/12/2018).  Future Appointments  Date Time Provider Department Center  12/29/2017  3:15 PM [facility] US 4 [facility]  01/05/2018 10:45 AM [facility] US 2 [facility]  01/12/2018  9:30 AM Arvilla MarketWallace, Catherine Lauren, DO [facility] None  01/12/2018  4:00 PM [facility] US 3 [facility]  01/19/2018  3:15 PM [facility] US 4 [facility]    Scheryl DarterJames Arnold, MD

## 2017-12-29 NOTE — Progress Notes (Signed)
Patient reports good fetal movement, reports occasional pressure when changing positions.

## 2017-12-29 NOTE — Patient Instructions (Signed)

## 2018-01-05 ENCOUNTER — Ambulatory Visit (HOSPITAL_COMMUNITY)
Admission: RE | Admit: 2018-01-05 | Discharge: 2018-01-05 | Disposition: A | Discharge status: Home / Self Care | Payer: Medicaid Other | Source: Ambulatory Visit | Attending: Obstetrics & Gynecology | Admitting: Obstetrics & Gynecology

## 2018-01-05 DIAGNOSIS — O133 Gestational [pregnancy-induced] hypertension without significant proteinuria, third trimester: Secondary | ICD-10-CM | POA: Diagnosis not present

## 2018-01-05 DIAGNOSIS — Z3A33 33 weeks gestation of pregnancy: Secondary | ICD-10-CM | POA: Diagnosis not present

## 2018-01-05 DIAGNOSIS — O139 Gestational [pregnancy-induced] hypertension without significant proteinuria, unspecified trimester: Secondary | ICD-10-CM | POA: Diagnosis not present

## 2018-01-12 ENCOUNTER — Ambulatory Visit (INDEPENDENT_AMBULATORY_CARE_PROVIDER_SITE_OTHER): Payer: Medicaid Other | Admitting: Internal Medicine

## 2018-01-12 ENCOUNTER — Ambulatory Visit (HOSPITAL_COMMUNITY)
Admission: RE | Admit: 2018-01-12 | Discharge: 2018-01-12 | Disposition: A | Discharge status: Home / Self Care | Payer: Medicaid Other | Source: Ambulatory Visit | Attending: Obstetrics & Gynecology | Admitting: Obstetrics & Gynecology

## 2018-01-12 VITALS — BP 134/77 | HR 91 | Wt 208.6 lb

## 2018-01-12 DIAGNOSIS — O133 Gestational [pregnancy-induced] hypertension without significant proteinuria, third trimester: Secondary | ICD-10-CM | POA: Diagnosis present

## 2018-01-12 DIAGNOSIS — Z3403 Encounter for supervision of normal first pregnancy, third trimester: Secondary | ICD-10-CM

## 2018-01-12 DIAGNOSIS — Z3400 Encounter for supervision of normal first pregnancy, unspecified trimester: Secondary | ICD-10-CM

## 2018-01-12 DIAGNOSIS — Z3A34 34 weeks gestation of pregnancy: Secondary | ICD-10-CM

## 2018-01-12 DIAGNOSIS — O139 Gestational [pregnancy-induced] hypertension without significant proteinuria, unspecified trimester: Secondary | ICD-10-CM

## 2018-01-12 NOTE — Progress Notes (Signed)
   PRENATAL VISIT NOTE  Subjective:  Brittany Gaines is a 22 y.o. G1P0 at 8171w5d being seen today for ongoing prenatal care.  She is currently monitored for the following issues for this high-risk pregnancy and has Supervision of normal first pregnancy, antepartum; Not immune to rubella; and Gestational hypertension without significant proteinuria, antepartum on their problem list.  Patient reports no complaints.  Contractions: Not present. Vag. Bleeding: None.  Movement: Present. Denies leaking of fluid.   The following portions of the patient's history were reviewed and updated as appropriate: allergies, current medications, past family history, past medical history, past social history, past surgical history and problem list. Problem list updated.  Objective:   Vitals:   01/12/18 0947  BP: 134/77  Pulse: 91  Weight: 208 lb 9.6 oz (94.6 kg)    Fetal Status: Fetal Heart Rate (bpm): 154 Fundal Height: 33 cm Movement: Present     General:  Alert, oriented and cooperative. Patient is in no acute distress.  Skin: Skin is warm and dry. No rash noted.   Cardiovascular: Normal heart rate noted  Respiratory: Normal respiratory effort, no problems with respiration noted  Abdomen: Soft, gravid, appropriate for gestational age.  Pain/Pressure: Present     Pelvic: Cervical exam deferred        Extremities: Normal range of motion.  Edema: Trace  Mental Status: Normal mood and affect. Normal behavior. Normal judgment and thought content.   Assessment and Plan:  Pregnancy: G1P0 at 1571w5d  1. Supervision of normal first pregnancy, antepartum Continue routine PNC.   2. Gestational hypertension without significant proteinuria, antepartum BP well controlled today. Continue 1 week BPPs. Discussed with patient that will likely have IOL at 37 weeks due to diagnosis of gestational HTN.    There are no diagnoses linked to this encounter. Preterm labor symptoms and general obstetric precautions  including but not limited to vaginal bleeding, contractions, leaking of fluid and fetal movement were reviewed in detail with the patient. Please refer to After Visit Summary for other counseling recommendations.  Return in about 1 week (around 01/19/2018) for high risk OB.  Future Appointments  Date Time Provider Department Center  01/12/2018  4:00 PM [facility] US 3 [facility]  01/19/2018  3:15 PM [facility] US 4 [facility]  01/21/2018  2:00 PM Adam PhenixArnold, James G, MD [facility] None    De Hollingsheadatherine L Aariona Momon, DO

## 2018-01-12 NOTE — Patient Instructions (Signed)
Call the office or go to [facility] if:  You begin to have strong, frequent contractions  Your water breaks.  Sometimes it is a big gush of fluid, sometimes it is just a trickle that keeps getting your panties wet or running down your legs  You have vaginal bleeding.  It is normal to have a small amount of spotting if your cervix was checked.   You don't feel your baby moving like normal.  If you don't, get you something to eat and drink and lay down and focus on feeling your baby move.  You should feel at least 10 movements in 2 hours.  If you don't, you should call the office or go to [facility].     Third Trimester of Pregnancy The third trimester is from week 29 through week 42, months 7 through 9. The third trimester is a time when the fetus is growing rapidly. At the end of the ninth month, the fetus is about 20 inches in length and weighs 6-10 pounds.  BODY CHANGES Your body goes through many changes during pregnancy. The changes vary from woman to woman.   Your weight will continue to increase. You can expect to gain 25-35 pounds (11-16 kg) by the end of the pregnancy.  You may begin to get stretch marks on your hips, abdomen, and breasts.  You may urinate more often because the fetus is moving lower into your pelvis and pressing on your bladder.  You may develop or continue to have heartburn as a result of your pregnancy.  You may develop constipation because certain hormones are causing the muscles that push waste through your intestines to slow down.  You may develop hemorrhoids or swollen, bulging veins (varicose veins).  You may have pelvic pain because of the weight gain and pregnancy hormones relaxing your joints between the bones in your pelvis. Backaches may result from overexertion of the muscles supporting your posture.  You may have changes in your hair. These can include thickening of your hair, rapid growth, and changes in texture. Some women also  have hair loss during or after pregnancy, or hair that feels dry or thin. Your hair will most likely return to normal after your baby is born.  Your breasts will continue to grow and be tender. A yellow discharge may leak from your breasts called colostrum.  Your belly button may stick out.  You may feel short of breath because of your expanding uterus.  You may notice the fetus "dropping," or moving lower in your abdomen.  You may have a bloody mucus discharge. This usually occurs a few days to a week before labor begins.  Your cervix becomes thin and soft (effaced) near your due date. WHAT TO EXPECT AT YOUR PRENATAL EXAMS  You will have prenatal exams every 2 weeks until week 36. Then, you will have weekly prenatal exams. During a routine prenatal visit:  You will be weighed to make sure you and the fetus are growing normally.  Your blood pressure is taken.  Your abdomen will be measured to track your baby's growth.  The fetal heartbeat will be listened to.  Any test results from the previous visit will be discussed.  You may have a cervical check near your due date to see if you have effaced. At around 36 weeks, your caregiver will check your cervix. At the same time, your caregiver will also perform a test on the secretions of the vaginal tissue. This test is to   if a type of bacteria, Group B streptococcus, is present. Your caregiver will explain this further. Your caregiver may ask you:  What your birth plan is.  How you are feeling.  If you are feeling the baby move.  If you have had any abnormal symptoms, such as leaking fluid, bleeding, severe headaches, or abdominal cramping.  If you have any questions. Other tests or screenings that may be performed during your third trimester include:  Blood tests that check for low iron levels (anemia).  Fetal testing to check the health, activity level, and growth of the fetus. Testing is done if you have certain medical  conditions or if there are problems during the pregnancy. FALSE LABOR You may feel small, irregular contractions that eventually go away. These are called Braxton Hicks contractions, or false labor. Contractions may last for hours, days, or even weeks before true labor sets in. If contractions come at regular intervals, intensify, or become painful, it is best to be seen by your caregiver.  SIGNS OF LABOR   Menstrual-like cramps.  Contractions that are 5 minutes apart or less.  Contractions that start on the top of the uterus and spread down to the lower abdomen and back.  A sense of increased pelvic pressure or back pain.  A watery or bloody mucus discharge that comes from the vagina. If you have any of these signs before the 37th week of pregnancy, call your caregiver right away. You need to go to the hospital to get checked immediately. HOME CARE INSTRUCTIONS   Avoid all smoking, herbs, alcohol, and unprescribed drugs. These chemicals affect the formation and growth of the baby.  Follow your caregiver's instructions regarding medicine use. There are medicines that are either safe or unsafe to take during pregnancy.  Exercise only as directed by your caregiver. Experiencing uterine cramps is a good sign to stop exercising.  Continue to eat regular, healthy meals.  Wear a good support bra for breast tenderness.  Do not use hot tubs, steam rooms, or saunas.  Wear your seat belt at all times when driving.  Avoid raw meat, uncooked cheese, cat litter boxes, and soil used by cats. These carry germs that can cause birth defects in the baby.  Take your prenatal vitamins.  Try taking a stool softener (if your caregiver approves) if you develop constipation. Eat more high-fiber foods, such as fresh vegetables or fruit and whole grains. Drink plenty of fluids to keep your urine clear or pale yellow.  Take warm sitz baths to soothe any pain or discomfort caused by hemorrhoids. Use  hemorrhoid cream if your caregiver approves.  If you develop varicose veins, wear support hose. Elevate your feet for 15 minutes, 3-4 times a day. Limit salt in your diet.  Avoid heavy lifting, wear low heal shoes, and practice good posture.  Rest a lot with your legs elevated if you have leg cramps or low back pain.  Visit your dentist if you have not gone during your pregnancy. Use a soft toothbrush to brush your teeth and be gentle when you floss.  A sexual relationship may be continued unless your caregiver directs you otherwise.  Do not travel far distances unless it is absolutely necessary and only with the approval of your caregiver.  Take prenatal classes to understand, practice, and ask questions about the labor and delivery.  Make a trial run to the hospital.  Pack your hospital bag.  Prepare the baby's nursery.  Continue to go to all your  your prenatal visits as directed by your caregiver. SEEK MEDICAL CARE IF:  You are unsure if you are in labor or if your water has broken.  You have dizziness.  You have mild pelvic cramps, pelvic pressure, or nagging pain in your abdominal area.  You have persistent nausea, vomiting, or diarrhea.  You have a bad smelling vaginal discharge.  You have pain with urination. SEEK IMMEDIATE MEDICAL CARE IF:   You have a fever.  You are leaking fluid from your vagina.  You have spotting or bleeding from your vagina.  You have severe abdominal cramping or pain.  You have rapid weight loss or gain.  You have shortness of breath with chest pain.  You notice sudden or extreme swelling of your face, hands, ankles, feet, or legs.  You have not felt your baby move in over an hour.  You have severe headaches that do not go away with medicine.  You have vision changes. Document Released: 12/24/2000 Document Revised: 01/04/2013 Document Reviewed: 03/02/2012 Exit[employer] Patient Information 2015 [employer]. This information is not  intended to replace advice given to you by your health care provider. Make sure you discuss any questions you have with your health care provider.  

## 2018-01-13 NOTE — L&D Delivery Note (Signed)
Delivery Note  Brittany Gaines is a 23 y.o. female G1P0 with IUP at [redacted]w[redacted]d admitted for PPROM with foley bulb insertion, IOL for gHTN.  She progressed with augmentation with foley bulb, Cytotec, and Pitocin to complete and pushed less than one hour to deliver.  Cord clamping delayed by 1-3 minutes then clamped by CNM and cut by FOB.  Placenta intact and spontaneous, bleeding minimal.  Second degree laceration repaired without difficulty.  Bilateral 1st degree labial, hemostatic, not repaired. Mom and baby stable prior to transfer to postpartum. She plans on breastfeeding.   At 8:22 AM a viable and healthy female was delivered via Vaginal, Spontaneous (Presentation: ROA ).  APGAR: 8, 9; weight  Pending.  Infant placed for skin-to-skin immediately after delivery.   Placenta status: spontaneous, intact.  Cord: 3 vessel with the following complications: none .  Anesthesia:  epidural Episiotomy:  none Lacerations: 1st degree;Labial;2nd degree;Perineal Suture Repair: 3.0 vicryl rapide Est. Blood Loss (mL): 100  Mom to postpartum.  Baby to Couplet care / Skin to Skin.  Misty Stanley Leftwich-Kirby 01/28/2018, 9:06 AM

## 2018-01-19 ENCOUNTER — Ambulatory Visit (HOSPITAL_COMMUNITY)
Admission: RE | Admit: 2018-01-19 | Discharge: 2018-01-19 | Disposition: A | Discharge status: Home / Self Care | Payer: Medicaid Other | Source: Ambulatory Visit | Attending: Internal Medicine | Admitting: Internal Medicine

## 2018-01-19 DIAGNOSIS — O133 Gestational [pregnancy-induced] hypertension without significant proteinuria, third trimester: Secondary | ICD-10-CM | POA: Diagnosis not present

## 2018-01-19 DIAGNOSIS — Z3A35 35 weeks gestation of pregnancy: Secondary | ICD-10-CM

## 2018-01-19 DIAGNOSIS — O139 Gestational [pregnancy-induced] hypertension without significant proteinuria, unspecified trimester: Secondary | ICD-10-CM

## 2018-01-20 ENCOUNTER — Other Ambulatory Visit (HOSPITAL_COMMUNITY): Payer: Self-pay | Admitting: *Deleted

## 2018-01-20 DIAGNOSIS — O133 Gestational [pregnancy-induced] hypertension without significant proteinuria, third trimester: Secondary | ICD-10-CM

## 2018-01-21 ENCOUNTER — Other Ambulatory Visit: Payer: Self-pay | Admitting: Obstetrics & Gynecology

## 2018-01-21 ENCOUNTER — Other Ambulatory Visit (HOSPITAL_COMMUNITY)
Admission: RE | Admit: 2018-01-21 | Discharge: 2018-01-21 | Disposition: A | Discharge status: Home / Self Care | Payer: Medicaid Other | Source: Ambulatory Visit | Attending: Obstetrics & Gynecology | Admitting: Obstetrics & Gynecology

## 2018-01-21 ENCOUNTER — Telehealth (HOSPITAL_COMMUNITY): Payer: Self-pay | Admitting: *Deleted

## 2018-01-21 ENCOUNTER — Ambulatory Visit (INDEPENDENT_AMBULATORY_CARE_PROVIDER_SITE_OTHER): Payer: Medicaid Other | Admitting: Obstetrics & Gynecology

## 2018-01-21 VITALS — BP 129/82 | HR 98 | Wt 212.0 lb

## 2018-01-21 DIAGNOSIS — Z3400 Encounter for supervision of normal first pregnancy, unspecified trimester: Secondary | ICD-10-CM | POA: Diagnosis present

## 2018-01-21 DIAGNOSIS — Z3403 Encounter for supervision of normal first pregnancy, third trimester: Secondary | ICD-10-CM

## 2018-01-21 DIAGNOSIS — O133 Gestational [pregnancy-induced] hypertension without significant proteinuria, third trimester: Secondary | ICD-10-CM

## 2018-01-21 DIAGNOSIS — O139 Gestational [pregnancy-induced] hypertension without significant proteinuria, unspecified trimester: Secondary | ICD-10-CM

## 2018-01-21 DIAGNOSIS — Z3A36 36 weeks gestation of pregnancy: Secondary | ICD-10-CM

## 2018-01-21 LAB — OB RESULTS CONSOLE GC/CHLAMYDIA: Gonorrhea: NEGATIVE

## 2018-01-21 NOTE — Progress Notes (Signed)
Orders for IOL at 37 weeks for [facility]

## 2018-01-21 NOTE — Progress Notes (Signed)
Patient reports fetal movement, denies contractions. 

## 2018-01-21 NOTE — Telephone Encounter (Signed)
Preadmission screen  

## 2018-01-21 NOTE — Patient Instructions (Signed)
Labor Induction    Labor induction is when steps are taken to cause a pregnant woman to begin the labor process. Most women go into labor on their own between 37 weeks and 42 weeks of pregnancy. When this does not happen or when there is a medical need for labor to begin, steps may be taken to induce labor. Labor induction causes a pregnant woman's uterus to contract. It also causes the cervix to soften (ripen), open (dilate), and thin out (efface). Usually, labor is not induced before 39 weeks of pregnancy unless there is a medical reason to do so. Your health care provider will determine if labor induction is needed.  Before inducing labor, your health care provider will consider a number of factors, including:  · Your medical condition and your baby's.  · How many weeks along you are in your pregnancy.  · How mature your baby's lungs are.  · The condition of your cervix.  · The position of your baby.  · The size of your birth canal.  What are some reasons for labor induction?  Labor may be induced if:  · Your health or your baby's health is at risk.  · Your pregnancy is overdue by 1 week or more.  · Your water breaks but labor does not start on its own.  · There is a low amount of amniotic fluid around your baby.  You may also choose (elect) to have labor induced at a certain time. Generally, elective labor induction is done no earlier than 39 weeks of pregnancy.  What methods are used for labor induction?  Methods used for labor induction include:  · Prostaglandin medicine. This medicine starts contractions and causes the cervix to dilate and ripen. It can be taken by mouth (orally) or by being inserted into the vagina (suppository).  · Inserting a small, thin tube (catheter) with a balloon into the vagina and then expanding the balloon with water to dilate the cervix.  · Stripping the membranes. In this method, your health care provider gently separates amniotic sac tissue from the cervix. This causes the  cervix to stretch, which in turn causes the release of a hormone called progesterone. The hormone causes the uterus to contract. This procedure is often done during an office visit, after which you will be sent home to wait for contractions to begin.  · Breaking the water. In this method, your health care provider uses a small instrument to make a small hole in the amniotic sac. This eventually causes the amniotic sac to break. Contractions should begin after a few hours.  · Medicine to trigger or strengthen contractions. This medicine is given through an IV that is inserted into a vein in your arm.  Except for membrane stripping, which can be done in a clinic, labor induction is done in the hospital so that you and your baby can be carefully monitored.  How long does it take for labor to be induced?  The length of time it takes to induce labor depends on how ready your body is for labor. Some inductions can take up to 2-3 days, while others may take less than a day. Induction may take longer if:  · You are induced early in your pregnancy.  · It is your first pregnancy.  · Your cervix is not ready.  What are some risks associated with labor induction?  Some risks associated with labor induction include:  · Changes in fetal heart rate, such as being too   high, too low, or irregular (erratic).  · Failed induction.  · Infection in the mother or the baby.  · Increased risk of having a cesarean delivery.  · Fetal death.  · Breaking off (abruption) of the placenta from the uterus (rare).  · Rupture of the uterus (very rare).  When induction is needed for medical reasons, the benefits of induction generally outweigh the risks.  What are some reasons for not inducing labor?  Labor induction should not be done if:  · Your baby does not tolerate contractions.  · You have had previous surgeries on your uterus, such as a myomectomy, removal of fibroids, or a vertical scar from a previous cesarean delivery.  · Your placenta lies  very low in your uterus and blocks the opening of the cervix (placenta previa).  · Your baby is not in a head-down position.  · The umbilical cord drops down into the birth canal in front of the baby.  · There are unusual circumstances, such as the baby being very early (premature).  · You have had more than 2 previous cesarean deliveries.  Summary  · Labor induction is when steps are taken to cause a pregnant woman to begin the labor process.  · Labor induction causes a pregnant woman's uterus to contract. It also causes the cervix to ripen, dilate, and efface.  · Labor is not induced before 39 weeks of pregnancy unless there is a medical reason to do so.  · When induction is needed for medical reasons, the benefits of induction generally outweigh the risks.  This information is not intended to replace advice given to you by your health care provider. Make sure you discuss any questions you have with your health care provider.  Document Released: 05/21/2006 Document Revised: 02/13/2016 Document Reviewed: 02/13/2016  Elsevier Interactive Patient Education © 2019 [employer]

## 2018-01-21 NOTE — Progress Notes (Signed)
   PRENATAL VISIT NOTE  Subjective:  Brittany Gaines is a 23 y.o. G1P0 at 3942w0d being seen today for ongoing prenatal care.  She is currently monitored for the following issues for this high-risk pregnancy and has Supervision of normal first pregnancy, antepartum; Not immune to rubella; and Gestational hypertension without significant proteinuria, antepartum on their problem list.  Patient reports occasional contractions.  Contractions: Not present. Vag. Bleeding: None.  Movement: Present. Denies leaking of fluid.   The following portions of the patient's history were reviewed and updated as appropriate: allergies, current medications, past family history, past medical history, past social history, past surgical history and problem list. Problem list updated.  Objective:   Vitals:   01/21/18 1352  BP: 129/82  Pulse: 98  Weight: 212 lb (96.2 kg)    Fetal Status: Fetal Heart Rate (bpm): 165   Movement: Present     General:  Alert, oriented and cooperative. Patient is in no acute distress.  Skin: Skin is warm and dry. No rash noted.   Cardiovascular: Normal heart rate noted  Respiratory: Normal respiratory effort, no problems with respiration noted  Abdomen: Soft, gravid, appropriate for gestational age.  Pain/Pressure: Present     Pelvic: Cervical exam performed        Extremities: Normal range of motion.  Edema: Trace  Mental Status: Normal mood and affect. Normal behavior. Normal judgment and thought content.   Assessment and Plan:  Pregnancy: G1P0 at 5442w0d  1. Supervision of normal first pregnancy, antepartum  - Strep Gp B NAA - Cervicovaginal ancillary only( [place])  2. Gestational hypertension without significant proteinuria, antepartum IOL 36 weeks, RTC for Foley  Preterm labor symptoms and general obstetric precautions including but not limited to vaginal bleeding, contractions, leaking of fluid and fetal movement were reviewed in detail with the patient. Please  refer to After Visit Summary for other counseling recommendations.  Return in about 6 days (around 01/27/2018) for foley bulb.  Future Appointments  Date Time Provider Department Center  01/26/2018  3:30 PM [facility] US 5 [facility]    Scheryl DarterJames Arnold, MD

## 2018-01-22 LAB — CERVICOVAGINAL ANCILLARY ONLY
Chlamydia: NEGATIVE
Neisseria Gonorrhea: NEGATIVE

## 2018-01-23 LAB — STREP GP B NAA: Strep Gp B NAA: NEGATIVE

## 2018-01-26 ENCOUNTER — Ambulatory Visit (HOSPITAL_BASED_OUTPATIENT_CLINIC_OR_DEPARTMENT_OTHER)
Admission: RE | Admit: 2018-01-26 | Discharge: 2018-01-26 | Disposition: A | Discharge status: Home / Self Care | Payer: Medicaid Other | Source: Ambulatory Visit | Attending: Internal Medicine | Admitting: Internal Medicine

## 2018-01-26 DIAGNOSIS — Z3A36 36 weeks gestation of pregnancy: Secondary | ICD-10-CM | POA: Diagnosis not present

## 2018-01-26 DIAGNOSIS — O133 Gestational [pregnancy-induced] hypertension without significant proteinuria, third trimester: Secondary | ICD-10-CM | POA: Insufficient documentation

## 2018-01-27 ENCOUNTER — Other Ambulatory Visit: Payer: Self-pay

## 2018-01-27 ENCOUNTER — Encounter (HOSPITAL_COMMUNITY): Payer: Self-pay

## 2018-01-27 ENCOUNTER — Encounter: Payer: Self-pay | Admitting: Obstetrics and Gynecology

## 2018-01-27 ENCOUNTER — Ambulatory Visit (INDEPENDENT_AMBULATORY_CARE_PROVIDER_SITE_OTHER): Payer: Medicaid Other | Admitting: Obstetrics and Gynecology

## 2018-01-27 ENCOUNTER — Inpatient Hospital Stay (HOSPITAL_COMMUNITY)
Admission: AD | Admit: 2018-01-27 | Discharge: 2018-01-30 | DRG: 807 | Disposition: A | Discharge status: Home / Self Care | Payer: Medicaid Other | Attending: Obstetrics & Gynecology | Admitting: Obstetrics & Gynecology

## 2018-01-27 VITALS — BP 134/84 | HR 99 | Wt 214.8 lb

## 2018-01-27 DIAGNOSIS — O133 Gestational [pregnancy-induced] hypertension without significant proteinuria, third trimester: Secondary | ICD-10-CM | POA: Diagnosis not present

## 2018-01-27 DIAGNOSIS — Z789 Other specified health status: Secondary | ICD-10-CM | POA: Diagnosis present

## 2018-01-27 DIAGNOSIS — O42013 Preterm premature rupture of membranes, onset of labor within 24 hours of rupture, third trimester: Secondary | ICD-10-CM | POA: Diagnosis not present

## 2018-01-27 DIAGNOSIS — O139 Gestational [pregnancy-induced] hypertension without significant proteinuria, unspecified trimester: Secondary | ICD-10-CM

## 2018-01-27 DIAGNOSIS — O134 Gestational [pregnancy-induced] hypertension without significant proteinuria, complicating childbirth: Secondary | ICD-10-CM | POA: Diagnosis present

## 2018-01-27 DIAGNOSIS — Z3A36 36 weeks gestation of pregnancy: Secondary | ICD-10-CM

## 2018-01-27 DIAGNOSIS — O4290 Premature rupture of membranes, unspecified as to length of time between rupture and onset of labor, unspecified weeks of gestation: Secondary | ICD-10-CM | POA: Diagnosis present

## 2018-01-27 DIAGNOSIS — O42913 Preterm premature rupture of membranes, unspecified as to length of time between rupture and onset of labor, third trimester: Principal | ICD-10-CM | POA: Diagnosis present

## 2018-01-27 DIAGNOSIS — Z3400 Encounter for supervision of normal first pregnancy, unspecified trimester: Secondary | ICD-10-CM

## 2018-01-27 DIAGNOSIS — Z3403 Encounter for supervision of normal first pregnancy, third trimester: Secondary | ICD-10-CM

## 2018-01-27 HISTORY — DX: Other specified health status: Z78.9

## 2018-01-27 LAB — COMPREHENSIVE METABOLIC PANEL
ALT: 12 U/L (ref 0–44)
AST: 16 U/L (ref 15–41)
Albumin: 3.1 g/dL — ABNORMAL LOW (ref 3.5–5.0)
Alkaline Phosphatase: 93 U/L (ref 38–126)
Anion gap: 7 (ref 5–15)
BUN: 11 mg/dL (ref 6–20)
CO2: 19 mmol/L — ABNORMAL LOW (ref 22–32)
Calcium: 8.6 mg/dL — ABNORMAL LOW (ref 8.9–10.3)
Chloride: 107 mmol/L (ref 98–111)
Creatinine, Ser: 0.54 mg/dL (ref 0.44–1.00)
GFR calc Af Amer: >60 mL/min (ref >60)
GFR calc non Af Amer: >60 mL/min (ref >60)
Glucose, Bld: 85 mg/dL (ref 70–99)
Potassium: 4 mmol/L (ref 3.5–5.1)
Sodium: 133 mmol/L — ABNORMAL LOW (ref 135–145)
Total Bilirubin: 0.5 mg/dL (ref 0.3–1.2)
Total Protein: 6.4 g/dL — ABNORMAL LOW (ref 6.5–8.1)

## 2018-01-27 LAB — CBC
HCT: 34.8 % — ABNORMAL LOW (ref 36.0–46.0)
Hemoglobin: 11.2 g/dL — ABNORMAL LOW (ref 12.0–15.0)
MCH: 29.5 pg (ref 26.0–34.0)
MCHC: 32.2 g/dL (ref 30.0–36.0)
MCV: 91.6 fL (ref 80.0–100.0)
Platelets: 232 10*3/uL (ref 150–400)
RBC: 3.8 MIL/uL — ABNORMAL LOW (ref 3.87–5.11)
RDW: 13.3 % (ref 11.5–15.5)
WBC: 8 10*3/uL (ref 4.0–10.5)
nRBC: 0 % (ref 0.0–0.2)

## 2018-01-27 LAB — PROTEIN / CREATININE RATIO, URINE
Creatinine, Urine: 241 mg/dL
Protein Creatinine Ratio: 0.15 mg/mg{Cre} (ref 0.00–0.15)
Total Protein, Urine: 37 mg/dL

## 2018-01-27 LAB — ABO/RH: ABO/RH(D): A POS

## 2018-01-27 MED ORDER — LACTATED RINGERS IV SOLN: 500.0000 mL | INTRAVENOUS | Status: DC | PRN

## 2018-01-27 MED ORDER — TERBUTALINE SULFATE 1 MG/ML IJ SOLN
0.2500 mg | Freq: Once | INTRAMUSCULAR | Status: DC | PRN
  Filled 2018-01-27: qty 1

## 2018-01-27 MED ORDER — OXYTOCIN BOLUS FROM INFUSION
500.0000 mL | Freq: Once | INTRAVENOUS | Status: AC
  Administered 2018-01-28: 500 mL via INTRAVENOUS

## 2018-01-27 MED ORDER — SOD CITRATE-CITRIC ACID 500-334 MG/5ML PO SOLN: 30.0000 mL | ORAL | Status: DC | PRN

## 2018-01-27 MED ORDER — OXYCODONE-ACETAMINOPHEN 5-325 MG PO TABS: 2.0000 | ORAL_TABLET | ORAL | Status: DC | PRN

## 2018-01-27 MED ORDER — ONDANSETRON HCL 4 MG/2ML IJ SOLN: 4.0000 mg | Freq: Four times a day (QID) | INTRAMUSCULAR | Status: DC | PRN

## 2018-01-27 MED ORDER — BETAMETHASONE SOD PHOS & ACET 6 (3-3) MG/ML IJ SUSP
12.0000 mg | Freq: Once | INTRAMUSCULAR | Status: AC
  Administered 2018-01-27: 12 mg via INTRAMUSCULAR
  Filled 2018-01-27: qty 2

## 2018-01-27 MED ORDER — OXYTOCIN 40 UNITS IN NORMAL SALINE INFUSION - SIMPLE MED
2.5000 [IU]/h | INTRAVENOUS | Status: DC
  Administered 2018-01-28: 2.5 [IU]/h via INTRAVENOUS
  Filled 2018-01-27: qty 1000

## 2018-01-27 MED ORDER — LACTATED RINGERS IV SOLN
INTRAVENOUS | Status: DC
  Administered 2018-01-27 – 2018-01-28 (×3): via INTRAVENOUS

## 2018-01-27 MED ORDER — ACETAMINOPHEN 325 MG PO TABS: 650.0000 mg | ORAL_TABLET | ORAL | Status: DC | PRN

## 2018-01-27 MED ORDER — FENTANYL CITRATE (PF) 100 MCG/2ML IJ SOLN
50.0000 ug | INTRAMUSCULAR | Status: DC | PRN
  Administered 2018-01-28: 100 ug via INTRAVENOUS
  Filled 2018-01-27: qty 2

## 2018-01-27 MED ORDER — OXYCODONE-ACETAMINOPHEN 5-325 MG PO TABS: 1.0000 | ORAL_TABLET | ORAL | Status: DC | PRN

## 2018-01-27 MED ORDER — LIDOCAINE HCL (PF) 1 % IJ SOLN
30.0000 mL | INTRAMUSCULAR | Status: DC | PRN
  Filled 2018-01-27: qty 30

## 2018-01-27 MED ORDER — MISOPROSTOL 50MCG HALF TABLET
50.0000 ug | ORAL_TABLET | ORAL | Status: DC | PRN
  Administered 2018-01-27 (×2): 50 ug via BUCCAL
  Filled 2018-01-27 (×3): qty 1

## 2018-01-27 NOTE — Progress Notes (Addendum)
Labor Progress Note Brittany Gaines is a 23 y.o. G1P0 at [redacted]w[redacted]d presented for PROM after FB placement in office for IOL scheduled for tomorrow due to gHTN.  S: Patient reports that her pain with contractions has been minimal and she has not needed any pain meds yet. Patient reports that her foley bulb fell out around 9:45pm and she experienced some LOF shortly after. She denies VB and has no other concerns.  O:  BP 136/82   Pulse 95   Temp 98.5 F (36.9 C) (Oral)   Resp 16   Ht 5' 9.5" (1.765 m)   Wt 97.1 kg   LMP 05/14/2017 (Approximate)   BMI 31.16 kg/m  EFM: 1/Thick/-3  CVE: Dilation: 1 Effacement (%): Thick Cervical Position: Middle Station: -3 Presentation: Vertex Exam by:: Mary [place] Johnson, RN    A&P: 23 y.o. G1P0 [redacted]w[redacted]d presented for iatrogenic ROM after office foley bulb placement for IOL scheduled for tomorrow.   #Labor: Progressing well. Has received 2 doses of buccal cytotec 50 mcg (last at 1954). Patient reported FB out at approximately 9:45pm. Will recheck around midnight.  #Pain: Well controlled without PRN meds right now. Planning on epidural. #FWB: Category 1 #GBS negative #Gestational hypertension: Variably elevated BP readings since admission (last was 136/82). Continue to monitor.  Melanie Mermiges, Student-PA 10:10 PM  Attestation: I have seen this patient and agree with the physician assistant student's documentation. I have examined them separately, and we have discussed the plan of care.   Cristal Deer. Earlene Plater, DO OB/GYN Fellow

## 2018-01-27 NOTE — Anesthesia Pain Management Evaluation Note (Signed)
  CRNA Pain Management Visit Note  Patient: Brittany KrabbeBrooke A Hamre, 23 y.o., female  "Hello I am a member of the anesthesia team at [facility] We have an anesthesia team available at all times to provide care throughout the hospital, including epidural management and anesthesia for C-section. I don't know your plan for the delivery whether it a natural birth, water birth, IV sedation, nitrous supplementation, doula or epidural, but we want to meet your pain goals."   1.Was your pain managed to your expectations on prior hospitalizations?   No prior hospitalizations  2.What is your expectation for pain management during this hospitalization?     Epidural  3.How can we help you reach that goal?   Record the patient's initial score and the patient's pain goal.   Pain: 2  Pain Goal: 5 [facility] to be able to say your pain was always managed very well.  Laban EmperorMalinova,Betzy Barbier Hristova 01/27/2018

## 2018-01-27 NOTE — Progress Notes (Signed)
Pt is here for ROB/ Foley bulb insertion. [redacted]w[redacted]d.

## 2018-01-27 NOTE — Progress Notes (Signed)
   PRENATAL VISIT NOTE  Subjective:  Brittany Gaines is a 23 y.o. G1P0 at [redacted]w[redacted]d being seen today for ongoing prenatal care.  She is currently monitored for the following issues for this high-risk pregnancy and has Supervision of normal first pregnancy, antepartum; Not immune to rubella; and Gestational hypertension without significant proteinuria, antepartum on their problem list.  Patient reports no complaints.  Contractions: Not present. Vag. Bleeding: None.  Movement: Present. Denies leaking of fluid.   The following portions of the patient's history were reviewed and updated as appropriate: allergies, current medications, past family history, past medical history, past social history, past surgical history and problem list. Problem list updated.  Objective:   Vitals:   01/27/18 1344  BP: 134/84  Pulse: 99  Weight: 214 lb 12.8 oz (97.4 kg)    Fetal Status: Fetal Heart Rate (bpm): NST   Movement: Present     General:  Alert, oriented and cooperative. Patient is in no acute distress.  Skin: Skin is warm and dry. No rash noted.   Cardiovascular: Normal heart rate noted  Respiratory: Normal respiratory effort, no problems with respiration noted  Abdomen: Soft, gravid, appropriate for gestational age.  Pain/Pressure: Present     Pelvic: Cervical exam deferred        Extremities: Normal range of motion.  Edema: Mild pitting, slight indentation  Mental Status: Normal mood and affect. Normal behavior. Normal judgment and thought content.   Assessment and Plan:  Pregnancy: G1P0 at [redacted]w[redacted]d  1. Supervision of normal first pregnancy, antepartum Patient is doing well without complaints  2. Gestational hypertension without significant proteinuria, antepartum NST reviewed and reactive with baseline 140, mod variability, +accels, no decels Patient scheduled for outpatient foley bulb placement in preparation for IOL tomorrow morning. After informed consent was obtained, speculum was placed in  the vagina. Foley catheter was grasped with a ring forceps and gently inserted into the cervix. AROM with clear fluid was noted before the injection of saline. Patient was sent to birthing suite for IOL. On call team notified  3. Not immune to rubella Will offer pp  Preterm labor symptoms and general obstetric precautions including but not limited to vaginal bleeding, contractions, leaking of fluid and fetal movement were reviewed in detail with the patient. Please refer to After Visit Summary for other counseling recommendations.  Return in about 6 weeks (around 03/10/2018) for postpartum.  Future Appointments  Date Time Provider Department Center  01/28/2018  7:30 AM [facility] ROOM [facility] None    Catalina Antigua, MD

## 2018-01-27 NOTE — H&P (Signed)
LABOR AND DELIVERY ADMISSION HISTORY AND PHYSICAL NOTE  Brittany Gaines is a 23 y.o. female G1P0 with IUP at [redacted]w[redacted]d by LMP presenting for PROM from the office after attempted FB insertion for GHTN.  She reports positive fetal movement. She denies vaginal bleeding.  Prenatal History/Complications: PNC at [facility] Pregnancy complications:  - Gestational Hypertension  - Rubella non immune   Past Medical History: Past Medical History:  Diagnosis Date  . Medical history non-contributory     Past Surgical History: No past surgical history on file.  Obstetrical History: OB History    Gravida  1   Para      Term      Preterm      AB      Living        SAB      TAB      Ectopic      Multiple      Live Births              Social History: Social History   Socioeconomic History  . Marital status: Not on file    Spouse name: Not on file  . Number of children: 0  . Years of education: Not on file  . Highest education level: Bachelor's degree (e.g., BA, AB, BS)  Occupational History  . Occupation: unemployed  Social Needs  . Financial resource strain: Not very hard  . Food insecurity:    Worry: Never true    Inability: Never true  . Transportation needs:    Medical: No    Non-medical: No  Tobacco Use  . Smoking status: Never Smoker  . Smokeless tobacco: Never Used  Substance and Sexual Activity  . Alcohol use: No  . Drug use: Never  . Sexual activity: Yes    Partners: Male    Birth control/protection: None  Lifestyle  . Physical activity:    Days per week: 0 days    Minutes per session: 0 min  . Stress: Only a little  Relationships  . Social connections:    Talks on phone: Three times a week    Gets together: Three times a week    Attends religious service: Never    Active member of club or organization: No    Attends meetings of clubs or organizations: Never    Relationship status: Never married  Other Topics Concern  . Not on file  Social  History Narrative  . Not on file    Family History: Family History  Problem Relation Age of Onset  . Birth defects Neg Hx   . Hearing loss Neg Hx   . Heart disease Neg Hx   . Miscarriages / Stillbirths Neg Hx     Allergies: No Known Allergies  Medications Prior to Admission  Medication Sig Dispense Refill Last Dose  . calcium carbonate (OS-CAL) 1250 (500 Ca) MG chewable tablet Chew 1 tablet by mouth daily.   Past Week at Unknown time  . Prenatal Vit-Fe Fumarate-FA (PRENATAL MULTIVITAMIN) TABS tablet Take 1 tablet by mouth daily at 12 noon.   01/26/2018 at Unknown time     Review of Systems  All systems reviewed and negative except as stated in HPI  Physical Exam Blood pressure 136/72, pulse 91, temperature 98.5 F (36.9 C), temperature source Oral, resp. rate 16, height 5' 9.5" (1.765 m), weight 97.1 kg, last menstrual period 05/14/2017. General appearance: alert, cooperative and no distress Lungs: clear to auscultation bilaterally Heart: regular rate and rhythm Abdomen:  soft, non-tender; bowel sounds normal Extremities: No calf swelling or tenderness Presentation: cephalic Fetal monitoring: 135/ moderate/ +accels/ no decelerations  Uterine activity: none  Dilation: 1 Effacement (%): Thick Station: -3 Exam by:: Mary SwazilandJordan Johnson, RN   Prenatal labs: ABO, Rh: --/--/A POS (01/15 1524) Antibody: NEG (01/15 1524) Rubella: <0.90 (07/10 1436) RPR: Non Reactive (11/15 1112)  HBsAg: Negative (07/10 1436)  HIV: Non Reactive (11/15 1112)  GC/Chlamydia: negative  GBS: Negative (01/09 1624)  2 hr Glucola: 77-150-106, normal  Genetic screening:  Low risk Anatomy US: normal  Nursing Staff Provider  Office Location  [facility]  Dating  LMP  Language  ENGLISH  Anatomy US  Normal  Flu Vaccine   10/15/17 Genetic Screen  NIPS:  Low risks    AFP:  negative    TDaP vaccine   11/27/17 Hgb A1C or  GTT Early  Third trimester nl 2 hour  Rhogam     LAB RESULTS   Feeding Plan BREAST  Blood Type A/Positive/-- (07/10 1436)   Contraception Undecided 01-21-18 Antibody Negative (07/10 1436)  Circumcision N/A Rubella <0.90 (07/10 1436)  Pediatrician  [facility] RPR Non Reactive (11/15 1112)   Support Person FOB HBsAg Negative (07/10 1436)   Prenatal Classes YES  HIV Non Reactive (11/15 1112)  BTL Consent  GBS  (For PCN allergy, check sensitivities)   VBAC Consent  Pap  negative     Hgb Electro      CF  negative    SMA 2 copies    Waterbirth  [ ]  Class [ ]  Consent [ ]  CNM visit   Prenatal Transfer Tool  Maternal Diabetes: No Genetic Screening: Normal Maternal Ultrasounds/Referrals: Normal Fetal Ultrasounds or other Referrals:  None Maternal Substance Abuse:  No Significant Maternal Medications:  None Significant Maternal Lab Results: Lab values include: Group B Strep negative  Results for orders placed or performed during the hospital encounter of 01/27/18 (from the past 24 hour(s))  CBC   Collection Time: 01/27/18  3:24 PM  Result Value Ref Range   WBC 8.0 4.0 - 10.5 K/uL   RBC 3.80 (L) 3.87 - 5.11 MIL/uL   Hemoglobin 11.2 (L) 12.0 - 15.0 g/dL   HCT 16.134.8 (L) 09.636.0 - 04.546.0 %   MCV 91.6 80.0 - 100.0 fL   MCH 29.5 26.0 - 34.0 pg   MCHC 32.2 30.0 - 36.0 g/dL   RDW 40.913.3 81.111.5 - 91.415.5 %   Platelets 232 150 - 400 K/uL   nRBC 0.0 0.0 - 0.2 %  Protein / creatinine ratio, urine   Collection Time: 01/27/18  3:24 PM  Result Value Ref Range   Creatinine, Urine 241.00 mg/dL   Total Protein, Urine 37 mg/dL   Protein Creatinine Ratio 0.15 0.00 - 0.15 mg/mg[Cre]  Comprehensive metabolic panel   Collection Time: 01/27/18  3:24 PM  Result Value Ref Range   Sodium 133 (L) 135 - 145 mmol/L   Potassium 4.0 3.5 - 5.1 mmol/L   Chloride 107 98 - 111 mmol/L   CO2 19 (L) 22 - 32 mmol/L   Glucose, Bld 85 70 - 99 mg/dL   BUN 11 6 - 20 mg/dL   Creatinine, Ser 7.820.54 0.44 - 1.00 mg/dL   Calcium 8.6 (L) 8.9 - 10.3 mg/dL   Total Protein 6.4 (L) 6.5 - 8.1 g/dL   Albumin 3.1 (L) 3.5 -  5.0 g/dL   AST 16 15 - 41 U/L   ALT 12 0 - 44 U/L  Alkaline Phosphatase 93 38 - 126 U/L   Total Bilirubin 0.5 0.3 - 1.2 mg/dL   GFR calc non Af Amer >60 >60 mL/min   GFR calc Af Amer >60 >60 mL/min   Anion gap 7 5 - 15  Type and screen [facility]   Collection Time: 01/27/18  3:24 PM  Result Value Ref Range   ABO/RH(D) A POS    Antibody Screen NEG    Sample Expiration      01/30/2018 Performed at [facility], [address] [place], [place] [address]     Patient Active Problem List   Diagnosis Date Noted  . PROM (premature rupture of membranes) 01/27/2018  . Gestational hypertension without significant proteinuria, antepartum 11/12/2017  . Not immune to rubella 08/12/2017  . Supervision of normal first pregnancy, antepartum 07/22/2017    Assessment: Maree KrabbeBrooke A Littles is a 23 y.o. G1P0 at 5773w6d here for PROM with attempted placement of FB   #Labor: IOL with FB and cytotec, FB placed @1550  #Pain: Pain medication ordered PRN  #FWB: Cat I  #ID:  GBS neg #MOF: Breast  #MOC: IUD vs Nuvaring  #Circ:  n/a  Sharyon CableRogers, Tyrique Sporn C, CNM 01/27/2018, 4:24 PM

## 2018-01-28 ENCOUNTER — Encounter (HOSPITAL_COMMUNITY): Payer: Self-pay

## 2018-01-28 ENCOUNTER — Inpatient Hospital Stay (HOSPITAL_COMMUNITY): Payer: Medicaid Other | Admitting: Anesthesiology

## 2018-01-28 ENCOUNTER — Inpatient Hospital Stay (HOSPITAL_COMMUNITY)
Admission: RE | Admit: 2018-01-28 | Discharge: 2018-01-28 | Disposition: A | Discharge status: Home / Self Care | Payer: Medicaid Other | Source: Ambulatory Visit | Attending: Obstetrics & Gynecology | Admitting: Obstetrics & Gynecology

## 2018-01-28 DIAGNOSIS — O134 Gestational [pregnancy-induced] hypertension without significant proteinuria, complicating childbirth: Secondary | ICD-10-CM

## 2018-01-28 DIAGNOSIS — Z3A36 36 weeks gestation of pregnancy: Secondary | ICD-10-CM

## 2018-01-28 DIAGNOSIS — O42013 Preterm premature rupture of membranes, onset of labor within 24 hours of rupture, third trimester: Secondary | ICD-10-CM

## 2018-01-28 LAB — CBC
HCT: 35.5 % — ABNORMAL LOW (ref 36.0–46.0)
Hemoglobin: 11.8 g/dL — ABNORMAL LOW (ref 12.0–15.0)
MCH: 29.9 pg (ref 26.0–34.0)
MCHC: 33.2 g/dL (ref 30.0–36.0)
MCV: 90.1 fL (ref 80.0–100.0)
Platelets: 218 10*3/uL (ref 150–400)
RBC: 3.94 MIL/uL (ref 3.87–5.11)
RDW: 13.2 % (ref 11.5–15.5)
WBC: 13.3 10*3/uL — ABNORMAL HIGH (ref 4.0–10.5)
nRBC: 0 % (ref 0.0–0.2)

## 2018-01-28 LAB — RPR: RPR Ser Ql: NONREACTIVE

## 2018-01-28 MED ORDER — PRENATAL MULTIVITAMIN CH
1.0000 | ORAL_TABLET | Freq: Every day | ORAL | Status: DC
  Administered 2018-01-29: 1 via ORAL
  Filled 2018-01-28: qty 1

## 2018-01-28 MED ORDER — DIBUCAINE 1 % RE OINT: 1.0000 "application " | TOPICAL_OINTMENT | RECTAL | Status: DC | PRN

## 2018-01-28 MED ORDER — ACETAMINOPHEN 325 MG PO TABS: 650.0000 mg | ORAL_TABLET | ORAL | Status: DC | PRN

## 2018-01-28 MED ORDER — LIDOCAINE HCL (PF) 1 % IJ SOLN
INTRAMUSCULAR | Status: DC | PRN
  Administered 2018-01-28: 5 mL via EPIDURAL

## 2018-01-28 MED ORDER — COCONUT OIL OIL: 1.0000 "application " | TOPICAL_OIL | Status: DC | PRN

## 2018-01-28 MED ORDER — PHENYLEPHRINE 40 MCG/ML (10ML) SYRINGE FOR IV PUSH (FOR BLOOD PRESSURE SUPPORT)
80.0000 ug | PREFILLED_SYRINGE | INTRAVENOUS | Status: DC | PRN
  Filled 2018-01-28 (×2): qty 10

## 2018-01-28 MED ORDER — LACTATED RINGERS IV SOLN: 500.0000 mL | Freq: Once | INTRAVENOUS | Status: DC

## 2018-01-28 MED ORDER — PHENYLEPHRINE 40 MCG/ML (10ML) SYRINGE FOR IV PUSH (FOR BLOOD PRESSURE SUPPORT): 80.0000 ug | PREFILLED_SYRINGE | INTRAVENOUS | Status: DC | PRN

## 2018-01-28 MED ORDER — ONDANSETRON HCL 4 MG PO TABS: 4.0000 mg | ORAL_TABLET | ORAL | Status: DC | PRN

## 2018-01-28 MED ORDER — ONDANSETRON HCL 4 MG/2ML IJ SOLN: 4.0000 mg | INTRAMUSCULAR | Status: DC | PRN

## 2018-01-28 MED ORDER — WITCH HAZEL-GLYCERIN EX PADS
1.0000 "application " | MEDICATED_PAD | CUTANEOUS | Status: DC | PRN
  Administered 2018-01-30: 1 via TOPICAL

## 2018-01-28 MED ORDER — ZOLPIDEM TARTRATE 5 MG PO TABS: 5.0000 mg | ORAL_TABLET | Freq: Every evening | ORAL | Status: DC | PRN

## 2018-01-28 MED ORDER — EPHEDRINE 5 MG/ML INJ: 10.0000 mg | INTRAVENOUS | Status: DC | PRN

## 2018-01-28 MED ORDER — FENTANYL 2.5 MCG/ML BUPIVACAINE 1/10 % EPIDURAL INFUSION (WH - ANES)
14.0000 mL/h | INTRAMUSCULAR | Status: DC | PRN
  Administered 2018-01-28: 14 mL/h via EPIDURAL

## 2018-01-28 MED ORDER — IBUPROFEN 600 MG PO TABS
600.0000 mg | ORAL_TABLET | Freq: Four times a day (QID) | ORAL | Status: DC
  Administered 2018-01-28 – 2018-01-30 (×8): 600 mg via ORAL
  Filled 2018-01-28 (×8): qty 1

## 2018-01-28 MED ORDER — LACTATED RINGERS IV SOLN
500.0000 mL | Freq: Once | INTRAVENOUS | Status: AC
  Administered 2018-01-28: 500 mL via INTRAVENOUS

## 2018-01-28 MED ORDER — EPHEDRINE 5 MG/ML INJ
10.0000 mg | INTRAVENOUS | Status: DC | PRN
  Filled 2018-01-28: qty 2

## 2018-01-28 MED ORDER — FENTANYL 2.5 MCG/ML BUPIVACAINE 1/10 % EPIDURAL INFUSION (WH - ANES)
INTRAMUSCULAR | Status: AC
  Filled 2018-01-28: qty 100

## 2018-01-28 MED ORDER — DIPHENHYDRAMINE HCL 50 MG/ML IJ SOLN: 12.5000 mg | INTRAMUSCULAR | Status: DC | PRN

## 2018-01-28 MED ORDER — SIMETHICONE 80 MG PO CHEW: 80.0000 mg | CHEWABLE_TABLET | ORAL | Status: DC | PRN

## 2018-01-28 MED ORDER — TETANUS-DIPHTH-ACELL PERTUSSIS 5-2.5-18.5 LF-MCG/0.5 IM SUSP: 0.5000 mL | Freq: Once | INTRAMUSCULAR | Status: DC

## 2018-01-28 MED ORDER — DIPHENHYDRAMINE HCL 25 MG PO CAPS: 25.0000 mg | ORAL_CAPSULE | Freq: Four times a day (QID) | ORAL | Status: DC | PRN

## 2018-01-28 MED ORDER — OXYTOCIN 40 UNITS IN NORMAL SALINE INFUSION - SIMPLE MED
1.0000 m[IU]/min | INTRAVENOUS | Status: DC
  Administered 2018-01-28: 2 m[IU]/min via INTRAVENOUS

## 2018-01-28 MED ORDER — LACTATED RINGERS AMNIOINFUSION
INTRAVENOUS | Status: DC
  Filled 2018-01-28: qty 1000

## 2018-01-28 MED ORDER — PHENYLEPHRINE 40 MCG/ML (10ML) SYRINGE FOR IV PUSH (FOR BLOOD PRESSURE SUPPORT)
80.0000 ug | PREFILLED_SYRINGE | INTRAVENOUS | Status: DC | PRN
  Filled 2018-01-28: qty 10

## 2018-01-28 MED ORDER — SENNOSIDES-DOCUSATE SODIUM 8.6-50 MG PO TABS
2.0000 | ORAL_TABLET | ORAL | Status: DC
  Administered 2018-01-29 (×2): 2 via ORAL
  Filled 2018-01-28 (×3): qty 2

## 2018-01-28 MED ORDER — BENZOCAINE-MENTHOL 20-0.5 % EX AERO
1.0000 "application " | INHALATION_SPRAY | CUTANEOUS | Status: DC | PRN
  Filled 2018-01-28: qty 56

## 2018-01-28 NOTE — Progress Notes (Signed)
Into room because of repetitive variables. Cervix checked and essentially unchanged, maybe with some more thinning. Given variables, IUPC placed with amnioinfusion. Continue to try position changes as well.   Cristal Deer. Earlene Plater, DO OB/GYN Fellow

## 2018-01-28 NOTE — Progress Notes (Signed)
OB/GYN Faculty Practice: Labor Progress Note  Subjective: Doing well, no complaints. Rating pain 4-5, starting to feel a little more intense. Still leaking fluid. Husband Tawanna Cooler still in the room.   Objective: BP 134/75   Pulse 96   Temp 98.6 F (37 C) (Oral)   Resp 18   Ht 5' 9.5" (1.765 m)   Wt 97.1 kg   LMP 05/14/2017 (Approximate)   BMI 31.16 kg/m  Gen: well-appearing, NAD Dilation: 5 Effacement (%): 70 Cervical Position: Middle Station: -2 Presentation: Vertex Exam by:: Marlis Edelson, DO  Assessment and Plan: 23 y.o. G1P0 [redacted]w[redacted]d here with IOL for gHTN after iatrogenic PPROM with outpatient FB placement.   Labor: Induction started with FB and cytotec. FB now out and SVE progressed to 5/70/-2. Will switch to pitocin and titrate per protocol -- pain control: desires epidural -- PPH Risk: low  Fetal Well-Being: EFW 3192g (88%) at 35w5. Cephalic by sutures.  -- Category I - continuous fetal monitoring  -- GBS negative   GHTN:  UPC 0.15 at admission, normal HELLP labs. -- continue to monitor BP  Juanya Villavicencio S. Earlene Plater, DO OB/GYN Fellow, Faculty Practice  12:11 AM

## 2018-01-28 NOTE — Anesthesia Preprocedure Evaluation (Signed)
Anesthesia Evaluation  Patient identified by MRN, date of birth, ID band Patient awake    Reviewed: Allergy & Precautions, NPO status , Patient's Chart, lab work & pertinent test results  Airway Mallampati: I  TM Distance: >3 FB Neck ROM: Full    Dental no notable dental hx. (+) Teeth Intact   Pulmonary neg pulmonary ROS,    Pulmonary exam normal breath sounds clear to auscultation       Cardiovascular hypertension, Normal cardiovascular exam Rhythm:Regular Rate:Normal  Gestational HTN   Neuro/Psych negative neurological ROS     GI/Hepatic   Endo/Other    Renal/GU      Musculoskeletal   Abdominal   Peds  Hematology   Anesthesia Other Findings   Reproductive/Obstetrics (+) Pregnancy                             Lab Results  Component Value Date   WBC 8.0 01/27/2018   HGB 11.2 (L) 01/27/2018   HCT 34.8 (L) 01/27/2018   MCV 91.6 01/27/2018   PLT 232 01/27/2018    Anesthesia Physical Anesthesia Plan  ASA: III  Anesthesia Plan: Epidural   Post-op Pain Management:    Induction:   PONV Risk Score and Plan: Treatment may vary due to age or medical condition  Airway Management Planned:   Additional Equipment:   Intra-op Plan:   Post-operative Plan:   Informed Consent: I have reviewed the patients History and Physical, chart, labs and discussed the procedure including the risks, benefits and alternatives for the proposed anesthesia with the patient or authorized representative who has indicated his/her understanding and acceptance.       Plan Discussed with:   Anesthesia Plan Comments:         Anesthesia Quick Evaluation

## 2018-01-28 NOTE — Discharge Summary (Signed)
Postpartum Discharge Summary     Patient Name: Brittany Gaines DOB: [birth year] MRN: [id number]  Date of admission: 01/27/2018 Delivering Provider: Fatima Blank A   Date of discharge: 01/30/2018  Admitting diagnosis: 37WKS, WATER BROKE Intrauterine pregnancy: [redacted]w[redacted]d    Secondary diagnosis:  Active Problems:   Not immune to rubella   Gestational hypertension without significant proteinuria, antepartum   PROM (premature rupture of membranes)   NSVD (normal spontaneous vaginal delivery)   Second degree laceration of perineum, delivered, current hospitalization  Additional problems: none     Discharge diagnosis: Term Pregnancy Delivered                                                                                                Post partum procedures:MMR recommended prior to d/c  Augmentation: Pitocin, Cytotec and Foley Balloon  Complications: None  Hospital course:  Induction of Labor With Vaginal Delivery   23y.o. yo G1P1001 at 355w0das admitted to the hospital 01/27/2018 for induction of labor.  Indication for induction: PROM and Gestational hypertension. Pt was having an outpatient cervical foley inserted on the day prior to her scheduled IOL when ROM occurred and then she was [facility] to [facility] She didn't have any severe range pressures and pre-e labs were neg.   Patient had an uncomplicated labor course as follows: Membrane Rupture Time/Date: 2:10 PM ,01/27/2018   Intrapartum Procedures: Episiotomy: None [1]                                         Lacerations:  1st degree [2];Labial [10];2nd degree [3];Perineal [11]  Patient had delivery of a Viable infant.  Information for the patient's newborn:  MaTyona, Nilsenirl BrKabrea0[[id number]]Delivery Method: Vag-Spont   01/28/2018  Details of delivery can be found in separate delivery note.  Patient had a routine postpartum course and remained normotensive. Patient is discharged home 01/30/18.  Magnesium Sulfate  recieved: No BMZ received: Yes- one dose on 1/15 at 36.6wks  Physical exam  Vitals:   01/29/18 0550 01/29/18 1457 01/29/18 2145 01/30/18 0604  BP: 119/70 124/73 129/75 127/75  Pulse: 82 82 74 76  Resp: _0 Temp: 97.8 F (36.6 C) 98 F (36.7 C) 98 F (36.7 C) 97.6 F (36.4 C)  TempSrc: Oral Oral Oral Oral  SpO2: 99%     Weight:      Height:       General: alert, cooperative and no distress Lochia: appropriate Uterine Fundus: firm Incision: N/A DVT Evaluation: No evidence of DVT seen on physical exam. Labs: Lab Results  Component Value Date   WBC 13.3 (H) 01/28/2018   HGB 11.8 (L) 01/28/2018   HCT 35.5 (L) 01/28/2018   MCV 90.1 01/28/2018   PLT 218 01/28/2018   CMP Latest Ref Rng & Units 01/27/2018  Glucose 70 - 99 mg/dL 85  BUN 6 - 20 mg/dL 11  Creatinine 0.44 - 1.00 mg/dL 0.54  Sodium 135 - 145  mmol/L 133(L)  Potassium 3.5 - 5.1 mmol/L 4.0  Chloride 98 - 111 mmol/L 107  CO2 22 - 32 mmol/L 19(L)  Calcium 8.9 - 10.3 mg/dL 8.6(L)  Total Protein 6.5 - 8.1 g/dL 6.4(L)  Total Bilirubin 0.3 - 1.2 mg/dL 0.5  Alkaline Phos 38 - 126 U/L 93  AST 15 - 41 U/L 16  ALT 0 - 44 U/L 12    Discharge instruction: per After Visit Summary and "Baby and Me Booklet".  After visit meds:  Allergies as of 01/30/2018   No Known Allergies     Medication List    STOP taking these medications   calcium carbonate 1250 (500 Ca) MG chewable tablet Commonly known as:  OS-CAL     TAKE these medications   ibuprofen 600 MG tablet Commonly known as:  ADVIL,MOTRIN Take 1 tablet (600 mg total) by mouth every 6 (six) hours as needed.   prenatal multivitamin Tabs tablet Take 1 tablet by mouth daily at 12 noon.       Diet: routine diet  Activity: Advance as tolerated. Pelvic rest for 6 weeks.   Outpatient follow up:Baby Love BP check in 1 wk; PP visit in 4 wks Follow up Appt: Future Appointments  Date Time Provider [place]  02/25/2018 10:30 AM Constant, Peggy, MD  [place] None   Follow up Visit:   Please schedule this patient for Postpartum visit in: 6 weeks with the following provider: Any provider For C/S patients schedule nurse incision check in weeks 2 weeks: no Low risk pregnancy complicated by: HTN Delivery mode:  SVD Anticipated Birth Control:  undecided PP Procedures needed: BP check in 1 week Schedule Integrated [place] visit: no      Newborn Data: Live born female  Birth Weight: 7 lb 2.6 oz (3249 g) APGAR: 8, 9  Newborn Delivery   Birth date/time:  01/28/2018 08:22:00 Delivery type:  Vaginal, Spontaneous     Baby Feeding: Bottle Disposition:home with mother   01/30/2018 Myrtis Ser, CNM  7:40 AM

## 2018-01-28 NOTE — Lactation Note (Signed)
This note was copied from a baby's chart. Lactation Consultation Note  Patient Name: Brittany Gaines Today's Date: 01/28/2018 Reason for consult: Initial assessment;Primapara;1st time breastfeeding;Early term 28-38.6wks GHTN  Visited with P1 Mom of ET infant at 5 hrs old.  Baby has latched for 5 mins.  Baby STS on FOB currently.    FOB had to go to [facility], so placed baby STS on Mom's chest.  Baby sleeping soundly.  Talked about normal ET newborn feeding behavior.  Recommended that baby remain STS on Mom until she is feeding consistently.  Talked about possible sleepiness due to baby being 37 weeks.   Talked about increased probability of newborn jaundice.  Recommended that baby be STS and Mom do some breast massage and hand expression.  Talked about spoon feeding colostrum to baby if she continues to be sleepy.    Talked about possible need for double pumping if baby continues to be sleepy and not breastfeeding.  Explained that baby was only 5 hrs old, and most all babies are sleepy at this time.   Mom to call for assistance with latching when baby shows any cues that she is hungry.  Goal of feeding baby 8-12 times per 24 hrs shared.  Mom aware of IP and OP lactation support available to her.    Mom encouraged to watch the Laverle Hobby hand expression video.    Interventions Interventions: Breast feeding basics reviewed;Skin to skin;Breast massage;Hand express;Support pillows  Lactation Tools Discussed/Used WIC Program: Yes   Consult Status Consult Status: Follow-up Date: 01/29/18 Follow-up type: In-patient    [name] 01/28/2018, 1:59 PM

## 2018-01-28 NOTE — Lactation Note (Signed)
This note was copied from a baby's chart. Lactation Consultation Note  Patient Name: Brittany Gaines [id number] Date: 01/28/2018    [facility] Follow Up Visit:  RN requested LC assistance.  Mother was resting when I arrived and the room was dark.  Asked her if she needed my assistance or if she had any questions/concerns related to breast feeding.  Mother stated that she was "fine" and that she saw [facility] earlier today.  If baby is not feeding better by the morning she will start using the DEBP.  Encouraged her to call for assistance as needed throughout the night.  RN updated.                Anasofia Micallef R Neftali Thurow 01/28/2018, 10:22 PM

## 2018-01-28 NOTE — Anesthesia Procedure Notes (Signed)
Epidural Patient location during procedure: OB Start time: 01/28/2018 1:53 AM End time: 01/28/2018 2:21 AM  Staffing Anesthesiologist: Trevor Iha, MD Performed: anesthesiologist   Preanesthetic Checklist Completed: patient identified, site marked, surgical consent, pre-op evaluation, timeout performed, IV checked, risks and benefits discussed and monitors and equipment checked  Epidural Patient position: sitting Prep: site prepped and draped and DuraPrep Patient monitoring: continuous pulse ox and blood pressure Approach: midline Location: L3-L4 Injection technique: LOR air  Needle:  Needle type: Tuohy  Needle gauge: 17 G Needle length: 9 cm and 9 Needle insertion depth: 6 cm Catheter type: closed end flexible Catheter size: 19 Gauge Catheter at skin depth: 12 cm Test dose: negative  Assessment Events: blood not aspirated, injection not painful, no injection resistance, negative IV test and no paresthesia  Additional Notes Patient identified. Risks/Benefits/Options discussed with patient including but not limited to bleeding, infection, nerve damage, paralysis, failed block, incomplete pain control, headache, blood pressure changes, nausea, vomiting, reactions to medication both or allergic, itching and postpartum back pain. Confirmed with bedside nurse the patient's most recent platelet count. Confirmed with patient that they are not currently taking any anticoagulation, have any bleeding history or any family history of bleeding disorders. Patient expressed understanding and wished to proceed. All questions were answered. Sterile technique was used throughout the entire procedure. Please see nursing notes for vital signs. Test dose was given through epidural needle and negative prior to continuing to dose epidural or start infusion. Warning signs of high block given to the patient including shortness of breath, tingling/numbness in hands, complete motor block, or any  concerning symptoms with instructions to call for help. Patient was given instructions on fall risk and not to get out of bed. All questions and concerns addressed with instructions to call with any issues.1  Attempt (S) . Patient tolerated procedure well.

## 2018-01-28 NOTE — Anesthesia Postprocedure Evaluation (Signed)
Anesthesia Post Note  Patient: Brittany Gaines  Procedure(s) Performed: AN AD HOC LABOR EPIDURAL     Patient location during evaluation: Mother Baby Anesthesia Type: Epidural Level of consciousness: awake and alert Pain management: pain level controlled Vital Signs Assessment: post-procedure vital signs reviewed and stable Respiratory status: spontaneous breathing, nonlabored ventilation and respiratory function stable Cardiovascular status: stable Postop Assessment: no headache, no backache, epidural receding, able to ambulate, adequate PO intake, no apparent nausea or vomiting and patient able to bend at knees Anesthetic complications: no    Last Vitals:  Vitals:   01/28/18 1020 01/28/18 1127  BP: 135/68 129/70  Pulse: (!) 111 (!) 108  Resp:    Temp: 37.2 C 37.1 C  SpO2:  99%    Last Pain:  Vitals:   01/28/18 1339  TempSrc:   PainSc: 3    Pain Goal:                @ANFLOW60MIN (12500)  )Kasi Lasky Hristova

## 2018-01-28 NOTE — Progress Notes (Signed)
OB/GYN Faculty Practice: Labor Progress Note  Subjective: Intermittently sleeping. Comfortable now with epidural in place. Discussed plan of care with RN.  Objective: BP (!) 124/57   Pulse 87   Temp 98.1 F (36.7 C) (Oral)   Resp 18   Ht 5' 9.5" (1.765 m)   Wt 97.1 kg   LMP 05/14/2017 (Approximate)   BMI 31.16 kg/m  Gen: strip note Dilation: 5.5 Effacement (%): 70 Cervical Position: Middle Station: -2 Presentation: Vertex Exam by:: Lajuana Matte, RNC  Assessment and Plan: 23 y.o. G1P0 [redacted]w[redacted]d here with IOL for gHTN after iatrogenic PPROM with outpatient FB placement.   Labor: Continuing to titrate pitocin as able. Minimal change with last SVE by RN.  -- pain control: epidural -- PPH Risk: low  Fetal Well-Being: EFW 3192g (88%) at 35w5. Cephalic by sutures.  -- Category II - continuous fetal monitoring - occasional variables, late decelerations, doing position changes  -- GBS negative   GHTN:  Normal to moderate range pressures. UPC 0.15 at admission, normal HELLP labs. -- continue to monitor BP  Brittany Zelman S. Earlene Plater, DO OB/GYN Fellow, Faculty Practice  4:27 AM

## 2018-01-29 MED ORDER — MEASLES, MUMPS & RUBELLA VAC IJ SOLR
0.5000 mL | Freq: Once | INTRAMUSCULAR | Status: DC
  Filled 2018-01-29: qty 0.5

## 2018-01-29 MED ORDER — MEASLES, MUMPS & RUBELLA VAC IJ SOLR
0.5000 mL | Freq: Once | INTRAMUSCULAR | Status: AC
  Administered 2018-01-30: 0.5 mL via SUBCUTANEOUS
  Filled 2018-01-29: qty 0.5

## 2018-01-29 NOTE — Progress Notes (Signed)
Post Partum Day #1 Subjective: no complaints, up ad lib and tolerating PO; breastfeeding going slowly- supplementing; considering IUD for pp contraception  Objective: Blood pressure 119/70, pulse 82, temperature 97.8 F (36.6 C), temperature source Oral, resp. rate 17, height 5' 9.5" (1.765 m), weight 97.1 kg, last menstrual period 05/14/2017, SpO2 99 %, unknown if currently breastfeeding.  Physical Exam:  General: alert and cooperative Lochia: appropriate Uterine Fundus: firm DVT Evaluation: No evidence of DVT seen on physical exam.  Recent Labs    01/27/18 1524 01/28/18 0151  HGB 11.2* 11.8*  HCT 34.8* 35.5*    Assessment/Plan: Plan for discharge tomorrow  MMR ordered   LOS: 2 days   Myrtis Ser CNM 01/29/2018, 10:39 AM

## 2018-01-30 LAB — TYPE AND SCREEN
ABO/RH(D): A POS
Antibody Screen: NEGATIVE

## 2018-01-30 MED ORDER — IBUPROFEN 600 MG PO TABS: 600.0000 mg | ORAL_TABLET | Freq: Four times a day (QID) | ORAL | 0 refills | Status: AC | PRN

## 2018-01-30 NOTE — Lactation Note (Signed)
This note was copied from a baby's chart. Lactation Consultation Note  Patient Name: Brittany Gaines [id number] Date: 01/30/2018  Per Nurse Fannie Knee) mom doesn't want [facility] services tonight she is pumping.   Maternal Data    Feeding Feeding Type: (mom stated she has not brfed tonight but is pumping )  LATCH Score Latch: (rn asked mom to call when feeds)                 Interventions    Lactation Tools Discussed/Used     Consult Status      Brittany Gaines 01/30/2018, 12:20 AM

## 2018-01-30 NOTE — Discharge Instructions (Signed)
Vaginal Delivery, Care After °Refer to this sheet in the next few weeks. These instructions provide you with information about caring for yourself after vaginal delivery. Your health care provider may also give you more specific instructions. Your treatment has been planned according to current medical practices, but problems sometimes occur. Call your health care provider if you have any problems or questions. °What can I expect after the procedure? °After vaginal delivery, it is common to have: °· Some bleeding from your vagina. °· Soreness in your abdomen, your vagina, and the area of skin between your vaginal opening and your anus (perineum). °· Pelvic cramps. °· Fatigue. °Follow these instructions at home: °Medicines °· Take over-the-counter and prescription medicines only as told by your health care provider. °· If you were prescribed an antibiotic medicine, take it as told by your health care provider. Do not stop taking the antibiotic until it is finished. °Driving ° °· Do not drive or operate heavy machinery while taking prescription pain medicine. °· Do not drive for 24 hours if you received a sedative. °Lifestyle °· Do not drink alcohol. This is especially important if you are breastfeeding or taking medicine to relieve pain. °· Do not use tobacco products, including cigarettes, chewing tobacco, or e-cigarettes. If you need help quitting, ask your health care provider. °Eating and drinking °· Drink at least 8 eight-ounce glasses of water every day unless you are told not to by your health care provider. If you choose to breastfeed your baby, you may need to drink more water than this. °· Eat high-fiber foods every day. These foods may help prevent or relieve constipation. High-fiber foods include: °? Whole grain cereals and breads. °? Brown rice. °? Beans. °? Fresh fruits and vegetables. °Activity °· Return to your normal activities as told by your health care provider. Ask your health care provider what  activities are safe for you. °· Rest as much as possible. Try to rest or take a nap when your baby is sleeping. °· Do not lift anything that is heavier than your baby or 10 lb (4.5 kg) until your health care provider says that it is safe. °· Talk with your health care provider about when you can engage in sexual activity. This may depend on your: °? Risk of infection. °? Rate of healing. °? Comfort and desire to engage in sexual activity. °Vaginal Care °· If you have an episiotomy or a vaginal tear, check the area every day for signs of infection. Check for: °? More redness, swelling, or pain. °? More fluid or blood. °? Warmth. °? Pus or a bad smell. °· Do not use tampons or douches until your health care provider says this is safe. °· Watch for any blood clots that may pass from your vagina. These may look like clumps of dark red, brown, or black discharge. °General instructions °· Keep your perineum clean and dry as told by your health care provider. °· Wear loose, comfortable clothing. °· Wipe from front to back when you use the toilet. °· Ask your health care provider if you can shower or take a bath. If you had an episiotomy or a perineal tear during labor and delivery, your health care provider may tell you not to take baths for a certain length of time. °· Wear a bra that supports your breasts and fits you well. °· If possible, have someone help you with household activities and help care for your baby for at least a few days after you   leave the hospital. °· Keep all follow-up visits for you and your baby as told by your health care provider. This is important. °Contact a health care provider if: °· You have: °? Vaginal discharge that has a bad smell. °? Difficulty urinating. °? Pain when urinating. °? A sudden increase or decrease in the frequency of your bowel movements. °? More redness, swelling, or pain around your episiotomy or vaginal tear. °? More fluid or blood coming from your episiotomy or vaginal  tear. °? Pus or a bad smell coming from your episiotomy or vaginal tear. °? A fever. °? A rash. °? Little or no interest in activities you used to enjoy. °? Questions about caring for yourself or your baby. °· Your episiotomy or vaginal tear feels warm to the touch. °· Your episiotomy or vaginal tear is separating or does not appear to be healing. °· Your breasts are painful, hard, or turn red. °· You feel unusually sad or worried. °· You feel nauseous or you vomit. °· You pass large blood clots from your vagina. If you pass a blood clot from your vagina, save it to show to your health care provider. Do not flush blood clots down the toilet without having your health care provider look at them. °· You urinate more than usual. °· You are dizzy or light-headed. °· You have not breastfed at all and you have not had a menstrual period for 12 weeks after delivery. °· You have stopped breastfeeding and you have not had a menstrual period for 12 weeks after you stopped breastfeeding. °Get help right away if: °· You have: °? Pain that does not go away or does not get better with medicine. °? Chest pain. °? Difficulty breathing. °? Blurred vision or spots in your vision. °? Thoughts about hurting yourself or your baby. °· You develop pain in your abdomen or in one of your legs. °· You develop a severe headache. °· You faint. °· You bleed from your vagina so much that you fill two sanitary pads in one hour. °This information is not intended to replace advice given to you by your health care provider. Make sure you discuss any questions you have with your health care provider. °Document Released: 12/28/1999 Document Revised: 06/13/2015 Document Reviewed: 01/14/2015 °Elsevier Interactive Patient Education © 2019 [employer] ° °

## 2018-02-25 ENCOUNTER — Ambulatory Visit (INDEPENDENT_AMBULATORY_CARE_PROVIDER_SITE_OTHER): Payer: Medicaid Other | Admitting: Obstetrics and Gynecology

## 2018-02-25 ENCOUNTER — Encounter: Payer: Self-pay | Admitting: Obstetrics and Gynecology

## 2018-02-25 DIAGNOSIS — Z3202 Encounter for pregnancy test, result negative: Secondary | ICD-10-CM | POA: Diagnosis not present

## 2018-02-25 DIAGNOSIS — Z30430 Encounter for insertion of intrauterine contraceptive device: Secondary | ICD-10-CM

## 2018-02-25 DIAGNOSIS — Z1389 Encounter for screening for other disorder: Secondary | ICD-10-CM | POA: Diagnosis not present

## 2018-02-25 LAB — POCT URINE PREGNANCY: Preg Test, Ur: NEGATIVE

## 2018-02-25 MED ORDER — LEVONORGESTREL 20 MCG/24HR IU IUD
INTRAUTERINE_SYSTEM | Freq: Once | INTRAUTERINE | Status: AC
  Administered 2018-02-25: 11:00:00 via INTRAUTERINE

## 2018-02-25 NOTE — Progress Notes (Signed)
Post Partum Exam  Brittany Gaines is a 23 y.o. G32P1001 female who presents for a postpartum visit. She is 4 weeks postpartum following a spontaneous vaginal delivery. I have fully reviewed the prenatal and intrapartum course. The delivery was at 37 gestational weeks.  Anesthesia: epidural. Postpartum course has been unremarkable. Baby's course has been unremarkable. Baby is feeding by bottle - [employer] Good Start. Bleeding no bleeding. Bowel function is normal. Bladder function is normal. Patient is not sexually active. Contraception method is IUD. Postpartum depression screening:neg (score 1)    Last pap smear done 07/22/17 and was Normal  Review of Systems Pertinent items are noted in HPI.    Objective:  Blood pressure 136/85, pulse 79, weight 196 lb 12.8 oz (89.3 kg), not currently breastfeeding.  General:  alert, cooperative and no distress   Breasts:  inspection negative, no nipple discharge or bleeding, no masses or nodularity palpable  Lungs: clear to auscultation bilaterally  Heart:  regular rate and rhythm  Abdomen: soft, non-tender; bowel sounds normal; no masses,  no organomegaly   Vulva:  normal  Vagina: normal vagina, no discharge, exudate, lesion, or erythema  Cervix:  multiparous appearance  Corpus: normal size, contour, position, consistency, mobility, non-tender  Adnexa:  normal adnexa and no mass, fullness, tenderness  Rectal Exam: Not performed.        Assessment:    Normal postpartum exam. Pap smear not done at today's visit.   Plan:   1. Contraception: IUD  IUD Procedure Note Patient identified, informed consent performed, signed copy in chart, time out was performed.  Urine pregnancy test negative.  Speculum placed in the vagina.  Cervix visualized.  Cleaned with Betadine x 2.  Grasped anteriorly with a single tooth tenaculum.  Uterus sounded to 8 cm.  Mirena IUD placed per manufacturer's recommendations.  Strings trimmed to 3 cm. Tenaculum was removed, good  hemostasis noted.  Patient tolerated procedure well.   Patient given post procedure instructions and Mirena care card with expiration date.  Patient is asked to check IUD strings periodically and follow up in 4-6 weeks for IUD check.   2. Patient is medically cleared to resume all activities of daily living 3. Follow up in: 4 weeks for IUD check or as needed.

## 2018-03-25 ENCOUNTER — Ambulatory Visit: Payer: Medicaid Other | Admitting: Obstetrics and Gynecology

## 2019-11-27 IMAGING — US US MFM FETAL BPP W/O NON-STRESS
1 series · 14 of 24 positions shown · non-contrast
Comparison: none

[Series 1: us mfm fetal bpp w/o non-stress · 24 acquisitions, 14 frames shown]
[im 1/24]
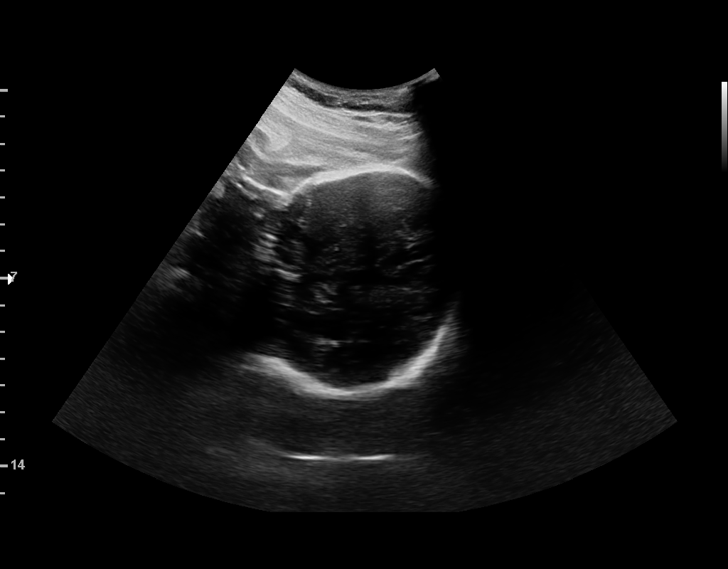
[im 3/24]
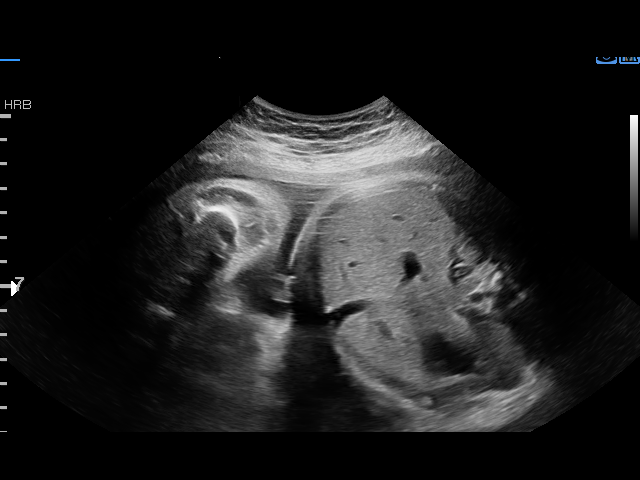
[im 5/24]
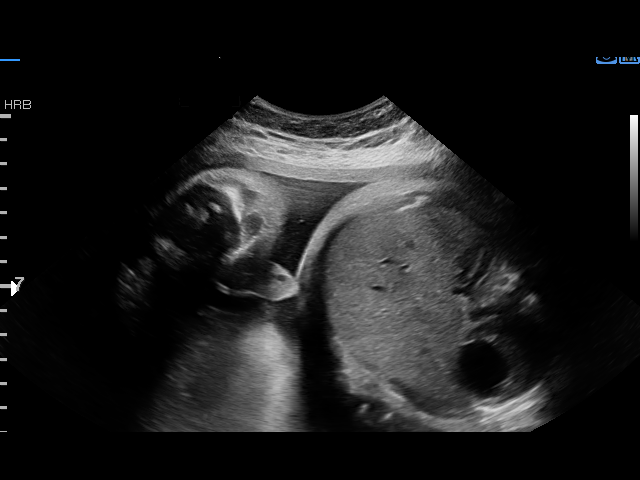
[im 7/24]
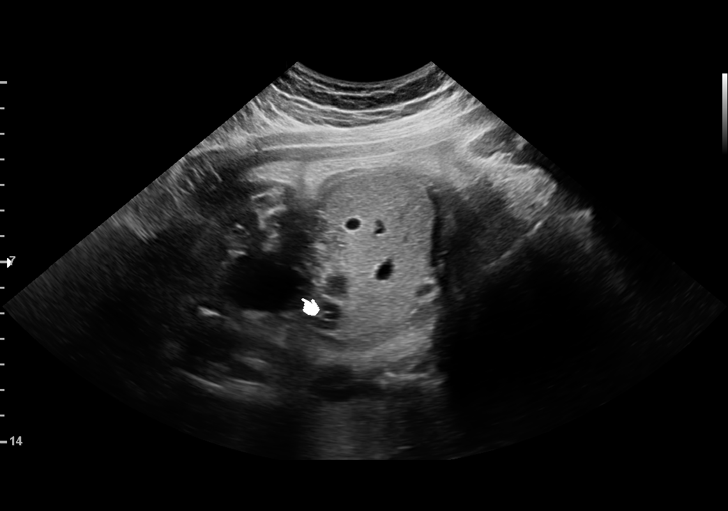
[im 8/24]
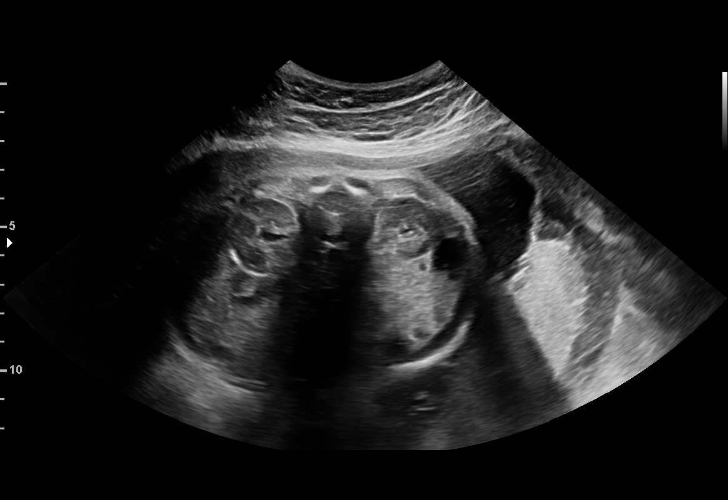
[im 10/24]
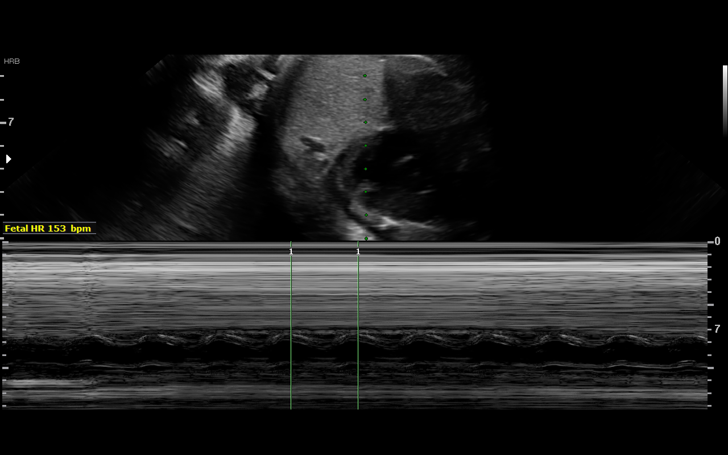
[im 12/24]
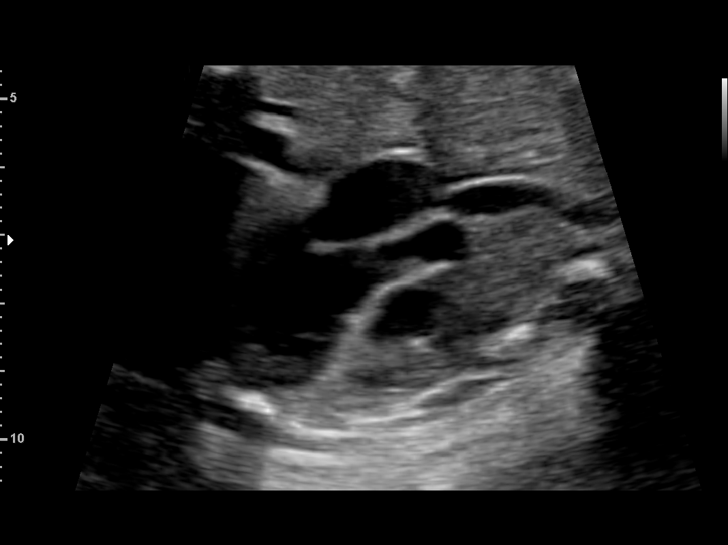
[im 13/24]
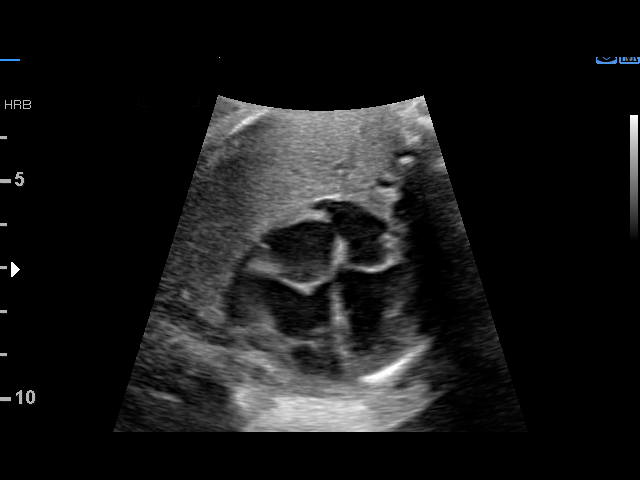
[im 15/24]
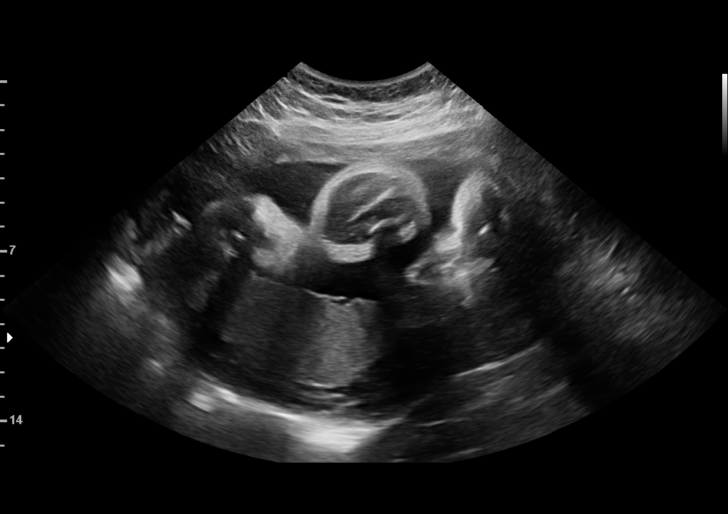
[im 17/24]
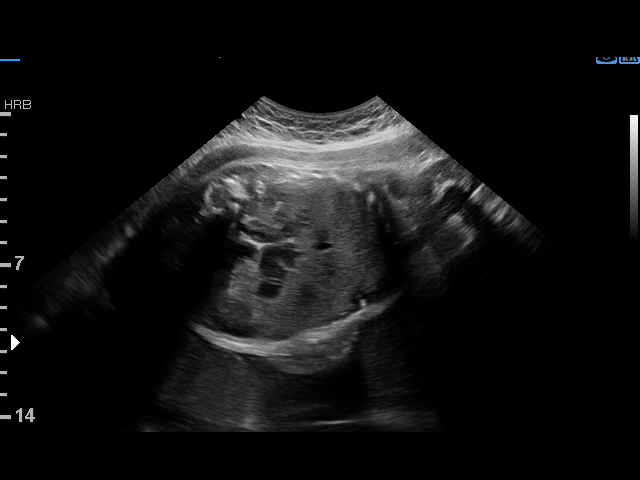
[im 19/24]
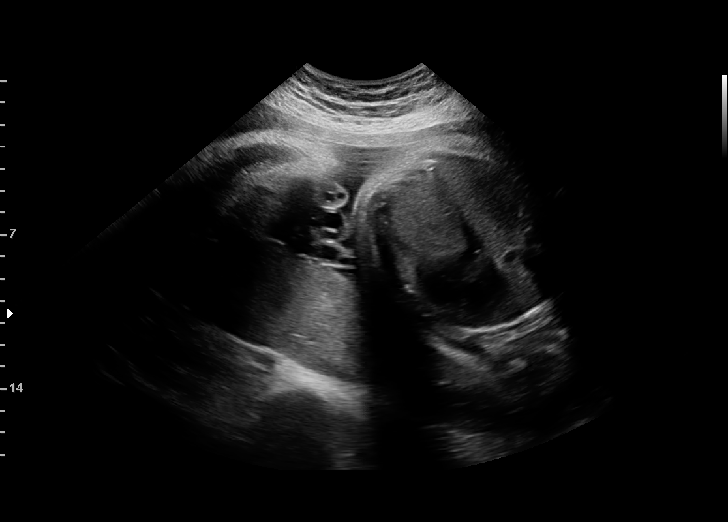
[im 20/24]
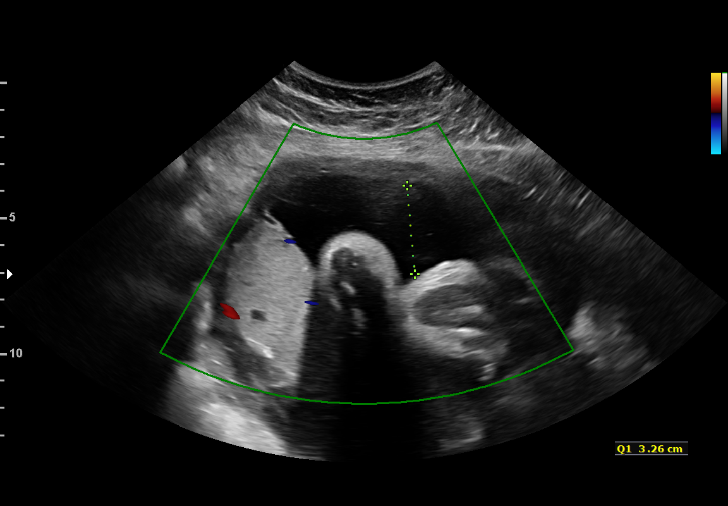
[im 22/24]
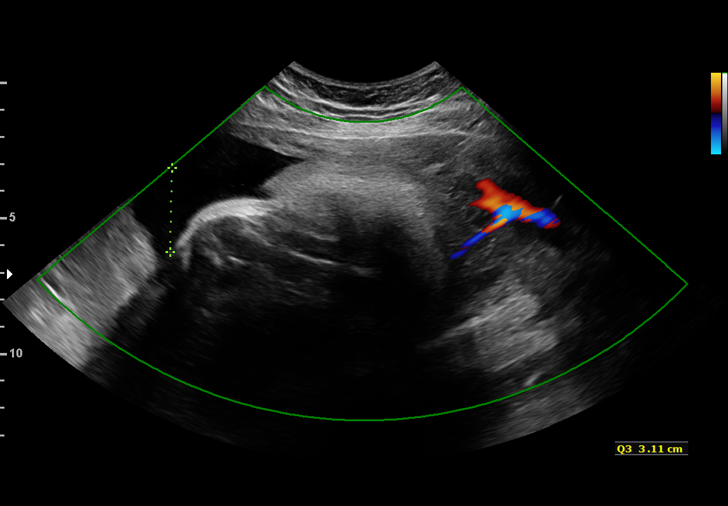
[im 24/24]
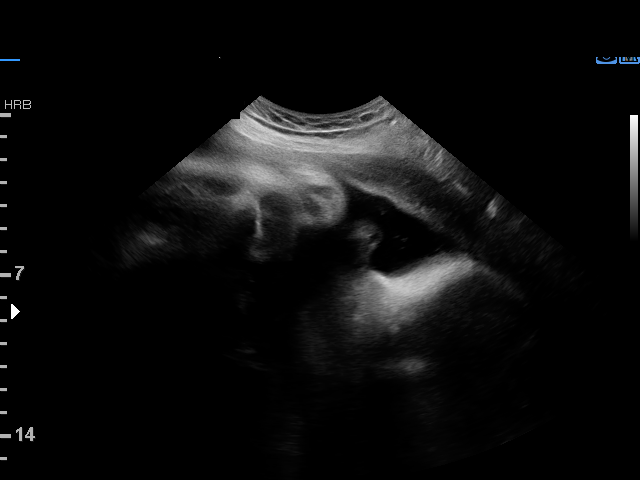

[14 of 24 positions shown; findings below may reference images not displayed]

Name:       BAYSE SAMAN                 Visit Date: 01/12/2018 [DATE]

     STRESS                                            LODERAUD
 ----------------------------------------------------------------------

 ----------------------------------------------------------------------
Indications

  Hypertension - Gestational
  34 weeks gestation of pregnancy
 ----------------------------------------------------------------------
Vital Signs

 (lb):
 BMI:
Fetal Evaluation

 Num Of Fetuses:          1
 Fetal Heart              153
 Rate(bpm):
 Cardiac Activity:        Observed
 Presentation:            Cephalic
 Placenta:                Posterior

 Amniotic Fluid
 AFI FV:      Within normal limits

 AFI Sum(cm)     %Tile       Largest Pocket(cm)
 14.88           53

 RUQ(cm)       RLQ(cm)       LUQ(cm)        LLQ(cm)

Biophysical Evaluation

 Amniotic F.V:   Within normal limits       F. Tone:         Observed
 F. Movement:    Observed                   Score:           [DATE]
 F. Breathing:   Observed
OB History

 Gravidity:    1         Term:   0        Prem:   0         SAB:   0
 TOP:          0       Ectopic:  0        Living: 0
Gestational Age

 LMP:           34w 5d        Date:  05/14/17                 EDD:    02/18/18
 Best:          34w 5d     Det. By:  LMP  (05/14/17)          EDD:    02/18/18
Anatomy

 Thoracic:              Appears normal         Abdomen:                Appears normal
 Diaphragm:             Appears normal         Kidneys:                Appear normal
 Stomach:               Appears normal,        Bladder:                Appears normal
                        left sided
Cervix Uterus Adnexa

 Cervix
 Not visualized (advanced GA >33wks)
Impression

 Biophysical profile [DATE]
Recommendations

 Follow up growth scheduled for 01/19/2017

## 2020-04-20 ENCOUNTER — Ambulatory Visit (HOSPITAL_COMMUNITY): Payer: Self-pay

## 2021-01-13 HISTORY — PX: WISDOM TOOTH EXTRACTION: SHX21

## 2021-03-25 ENCOUNTER — Other Ambulatory Visit (HOSPITAL_COMMUNITY)
Admission: RE | Admit: 2021-03-25 | Discharge: 2021-03-25 | Disposition: A | Discharge status: Home / Self Care | Payer: Medicaid Other | Source: Ambulatory Visit | Attending: Obstetrics and Gynecology | Admitting: Obstetrics and Gynecology

## 2021-03-25 ENCOUNTER — Encounter: Payer: Self-pay | Admitting: Obstetrics and Gynecology

## 2021-03-25 ENCOUNTER — Other Ambulatory Visit: Payer: Self-pay

## 2021-03-25 ENCOUNTER — Ambulatory Visit (INDEPENDENT_AMBULATORY_CARE_PROVIDER_SITE_OTHER): Payer: Medicaid Other | Admitting: Obstetrics and Gynecology

## 2021-03-25 VITALS — BP 136/90 | HR 87 | Ht 70.0 in | Wt 182.9 lb

## 2021-03-25 DIAGNOSIS — Z01419 Encounter for gynecological examination (general) (routine) without abnormal findings: Secondary | ICD-10-CM

## 2021-03-25 NOTE — Progress Notes (Signed)
Subjective:  ?  ? Brittany Gaines is a 26 y.o. female P1 with BMI 26 and Mirena induced amenorrhea who is here for a comprehensive physical exam. The patient reports no problems. Patient denies any pelvic pain or abnormal discharge. She is sexually active with a new partner without complaints. She is happy with the Mirena IUD. Patient is without any other complaints.  ? ?Past Medical History:  ?Diagnosis Date  ? Medical history non-contributory   ? ?Past Surgical History:  ?Procedure Laterality Date  ? WISDOM TOOTH EXTRACTION  2023  ? ?Family History  ?Problem Relation Age of Onset  ? Diabetes Mother   ? Asthma Brother   ? Birth defects Neg Hx   ? Hearing loss Neg Hx   ? Heart disease Neg Hx   ? Miscarriages / Stillbirths Neg Hx   ? ? ?Social History  ? ?Socioeconomic History  ? Marital status: Unknown  ?  Spouse name: Not on file  ? Number of children: 0  ? Years of education: Not on file  ? Highest education level: Bachelor's degree (e.g., BA, AB, BS)  ?Occupational History  ? Occupation: unemployed  ?Tobacco Use  ? Smoking status: Never  ? Smokeless tobacco: Never  ?Vaping Use  ? Vaping Use: Never used  ?Substance and Sexual Activity  ? Alcohol use: Yes  ?  Comment: socially  ? Drug use: Never  ? Sexual activity: Yes  ?  Partners: Male  ?  Birth control/protection: I.U.D.  ?Other Topics Concern  ? Not on file  ?Social History Narrative  ? Not on file  ? ?Social Determinants of Health  ? ?Financial Resource Strain: Not on file  ?Food Insecurity: Not on file  ?Transportation Needs: Not on file  ?Physical Activity: Not on file  ?Stress: Not on file  ?Social Connections: Not on file  ?Intimate Partner Violence: Not on file  ? ?Health Maintenance  ?Topic Date Due  ? Hepatitis C Screening  Never done  ? PAP-Cervical Cytology Screening  07/22/2020  ? PAP SMEAR-Modifier  07/22/2020  ? INFLUENZA VACCINE  08/13/2020  ? TETANUS/TDAP  11/28/2027  ? HIV Screening  Completed  ? HPV VACCINES  Aged Out  ? ? ?  ? ?Review of  Systems ?Pertinent items noted in HPI and remainder of comprehensive ROS otherwise negative.  ? ?Objective:  ?Blood pressure 136/90, pulse 87, height 5\' 10"  (1.778 m), weight 182 lb 14.4 oz (83 kg). ? ? GENERAL: Well-developed, well-nourished female in no acute distress.  ?HEENT: Normocephalic, atraumatic. Sclerae anicteric.  ?NECK: Supple. Normal thyroid.  ?LUNGS: Clear to auscultation bilaterally.  ?HEART: Regular rate and rhythm. ?BREASTS: Symmetric in size. No palpable masses or lymphadenopathy, skin changes, or nipple drainage. ?ABDOMEN: Soft, nontender, nondistended. No organomegaly. ?PELVIC: Normal external female genitalia. Vagina is pink and rugated.  Normal discharge. Normal appearing cervix with IUD strings visualized at the os. Uterus is normal in size. No adnexal mass or tenderness. Chaperone present during the pelvic exam ?EXTREMITIES: No cyanosis, clubbing, or edema, 2+ distal pulses. ?  ?  ?Assessment:  ? ? Healthy female exam.    ?  ?Plan:  ? ? Pap smear collected ?Vaginal swab collected ?Patient declined STI blood work ?Patient will be contacted with abnormal results ?See After Visit Summary for Counseling Recommendations  ? ?

## 2021-03-25 NOTE — Progress Notes (Signed)
Pt presents for annual exam and STD testing.  ?

## 2021-03-26 LAB — CERVICOVAGINAL ANCILLARY ONLY
Chlamydia: NEGATIVE
Comment: NEGATIVE
Comment: NORMAL
Neisseria Gonorrhea: NEGATIVE

## 2021-03-26 LAB — CYTOLOGY - PAP: Diagnosis: NEGATIVE

## 2023-08-12 ENCOUNTER — Emergency Department (HOSPITAL_COMMUNITY)

## 2023-08-12 ENCOUNTER — Other Ambulatory Visit: Payer: Self-pay

## 2023-08-12 ENCOUNTER — Emergency Department (HOSPITAL_COMMUNITY)
Admission: EM | Admit: 2023-08-12 | Discharge: 2023-08-12 | Discharge status: Against Medical Advice | Attending: Emergency Medicine | Admitting: Emergency Medicine

## 2023-08-12 DIAGNOSIS — R0789 Other chest pain: Secondary | ICD-10-CM | POA: Insufficient documentation

## 2023-08-12 DIAGNOSIS — Z5321 Procedure and treatment not carried out due to patient leaving prior to being seen by health care provider: Secondary | ICD-10-CM | POA: Diagnosis not present

## 2023-08-12 LAB — CBC
HCT: 45.4 % (ref 36.0–46.0)
Hemoglobin: 14.6 g/dL (ref 12.0–15.0)
MCH: 27.7 pg (ref 26.0–34.0)
MCHC: 32.2 g/dL (ref 30.0–36.0)
MCV: 86 fL (ref 80.0–100.0)
Platelets: 328 K/uL (ref 150–400)
RBC: 5.28 MIL/uL — ABNORMAL HIGH (ref 3.87–5.11)
RDW: 13.7 % (ref 11.5–15.5)
WBC: 8.6 K/uL (ref 4.0–10.5)
nRBC: 0 % (ref 0.0–0.2)

## 2023-08-12 LAB — BASIC METABOLIC PANEL WITH GFR
Anion gap: 7 (ref 5–15)
BUN: 16 mg/dL (ref 6–20)
CO2: 27 mmol/L (ref 22–32)
Calcium: 9.2 mg/dL (ref 8.9–10.3)
Chloride: 103 mmol/L (ref 98–111)
Creatinine, Ser: 1.3 mg/dL — ABNORMAL HIGH (ref 0.44–1.00)
GFR, Estimated: 57 mL/min — ABNORMAL LOW (ref >60)
Glucose, Bld: 83 mg/dL (ref 70–99)
Potassium: 3.9 mmol/L (ref 3.5–5.1)
Sodium: 137 mmol/L (ref 135–145)

## 2023-08-12 LAB — PREGNANCY, URINE: Preg Test, Ur: NEGATIVE

## 2023-08-12 LAB — POC URINE PREG, ED: Preg Test, Ur: NEGATIVE

## 2023-08-12 LAB — TROPONIN I (HIGH SENSITIVITY): Troponin I (High Sensitivity): 15 ng/L (ref <18)

## 2023-08-12 LAB — D-DIMER, QUANTITATIVE: D-Dimer, Quant: <0.27 ug{FEU}/mL (ref 0.00–0.50)

## 2023-08-12 MED ORDER — DIAZEPAM 5 MG PO TABS
5.0000 mg | ORAL_TABLET | Freq: Once | ORAL | Status: AC
  Administered 2023-08-12: 5 mg via ORAL
  Filled 2023-08-12: qty 1

## 2023-08-12 MED ORDER — IPRATROPIUM-ALBUTEROL 0.5-2.5 (3) MG/3ML IN SOLN
3.0000 mL | Freq: Once | RESPIRATORY_TRACT | Status: AC
  Administered 2023-08-12: 3 mL via RESPIRATORY_TRACT
  Filled 2023-08-12: qty 3

## 2023-08-12 NOTE — ED Provider Triage Note (Addendum)
 Emergency Medicine Provider Triage Evaluation Note  UMI MAINOR , a 28 y.o. female  was evaluated in triage.  Pt complains of chest tightness. Has been ongoing for a few months, seems to be worsened in last 24 hours, more noticeable per pt. Thinks perhaps it is being provoked by her anxiety. She has no cardiac hx, no recent travel, no leg swelling, she uses IUD for contraception, no hx vte.   Review of Systems  Positive: Chest tightness Negative: fever  Physical Exam  BP 127/88   Pulse (!) 108   Temp 98.6 F (37 C)   Resp 16   SpO2 100%  Gen:   Awake, no distress   Resp:  Normal effort, clear b/l MSK:   Moves extremities without difficulty, no LE edema Other:  anxious  Medical Decision Making  Medically screening exam initiated at 9:25 AM.  Appropriate orders placed.  RILEY PAPIN was informed that the remainder of the evaluation will be completed by another provider, this initial triage assessment does not replace that evaluation, and the importance of remaining in the ED until their evaluation is complete.  Feel she would benefit from anxiolytic, will give valium  once preg back    Elnor Jayson DELENA, DO 08/12/23 [id number]   POC preg neg, give valium    Elnor Jayson DELENA, DO 08/12/23 1007

## 2023-08-12 NOTE — ED Triage Notes (Signed)
 Pt. Stated, Jesus had some chest tightness since February , and Im just more aware of it. I do have some SOB.
